# Patient Record
Sex: Female | Born: 1990 | Race: White | Hispanic: No | Marital: Single | Smoking: Current every day smoker
Health system: Southern US, Community
[De-identification: ages and names within clinical notes are randomized; demographics above are authoritative.]

## PROBLEM LIST (undated history)

## (undated) ENCOUNTER — Ambulatory Visit: Discharge status: Absent without leave | Payer: Medicaid Other

## (undated) ENCOUNTER — Inpatient Hospital Stay: Payer: Self-pay

## (undated) DIAGNOSIS — O139 Gestational [pregnancy-induced] hypertension without significant proteinuria, unspecified trimester: Secondary | ICD-10-CM

## (undated) DIAGNOSIS — F1910 Other psychoactive substance abuse, uncomplicated: Secondary | ICD-10-CM

## (undated) DIAGNOSIS — B182 Chronic viral hepatitis C: Secondary | ICD-10-CM

## (undated) DIAGNOSIS — Z720 Tobacco use: Secondary | ICD-10-CM

## (undated) HISTORY — PX: EXTERNAL EAR SURGERY: SHX627

## (undated) HISTORY — PX: WISDOM TOOTH EXTRACTION: SHX21

---
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
## 2009-04-14 ENCOUNTER — Emergency Department: Payer: Self-pay | Admitting: Emergency Medicine

## 2009-10-25 ENCOUNTER — Emergency Department: Payer: Self-pay | Admitting: Emergency Medicine

## 2009-12-05 ENCOUNTER — Ambulatory Visit: Payer: Self-pay | Admitting: Internal Medicine

## 2010-04-22 ENCOUNTER — Emergency Department: Payer: Self-pay | Admitting: Emergency Medicine

## 2010-09-08 ENCOUNTER — Ambulatory Visit: Payer: Self-pay | Admitting: Internal Medicine

## 2012-04-07 DIAGNOSIS — N6019 Diffuse cystic mastopathy of unspecified breast: Secondary | ICD-10-CM | POA: Insufficient documentation

## 2012-12-30 DIAGNOSIS — G43909 Migraine, unspecified, not intractable, without status migrainosus: Secondary | ICD-10-CM | POA: Insufficient documentation

## 2013-01-15 ENCOUNTER — Ambulatory Visit: Payer: Self-pay | Admitting: Physician Assistant

## 2016-04-13 ENCOUNTER — Emergency Department
Admission: EM | Admit: 2016-04-13 | Discharge: 2016-04-13 | Disposition: A | Discharge status: Home / Self Care | Payer: Self-pay | Attending: Emergency Medicine | Admitting: Emergency Medicine

## 2016-04-13 DIAGNOSIS — T401X1A Poisoning by heroin, accidental (unintentional), initial encounter: Secondary | ICD-10-CM | POA: Insufficient documentation

## 2016-04-13 MED ORDER — SODIUM CHLORIDE 0.9 % IV BOLUS (SEPSIS)
1000.0000 mL | Freq: Once | INTRAVENOUS | Status: AC
  Administered 2016-04-13: 1000 mL via INTRAVENOUS

## 2016-04-13 MED ORDER — NALOXONE HCL 2 MG/2ML IJ SOSY
0.4000 mg | PREFILLED_SYRINGE | Freq: Once | INTRAMUSCULAR | Status: AC
  Administered 2016-04-13: 0.4 mg via INTRAVENOUS

## 2016-04-13 NOTE — ED Provider Notes (Signed)
Time Seen: Approximately 1941  I have reviewed the triage notes  Chief Complaint: Drug Overdose   History of Present Illness: Karen PaliStephanie N Medal is a 25 y.o. female who presents after a heroin overdose. Patient was brought here by her boyfriend and states this was not heroin laced with said no. Patient's used heroin in the past. Patient arrives respirations at 6 pulse ox is normal and seems to be protecting her airway.   No past medical history on file.  There are no active problems to display for this patient.   No past surgical history on file.  No past surgical history on file.    Allergies:  Latex  Family History: No family history on file.  Social History: Social History  Substance Use Topics  . Smoking status: Not on file  . Smokeless tobacco: Not on file  . Alcohol use Not on file     Review of Systems:   10 point review of systems was performed and was otherwise negative: After Narcan the patient was able to give a review of systems Constitutional: No fever Eyes: No visual disturbances ENT: No sore throat, ear pain Cardiac: No chest pain Respiratory: No shortness of breath, wheezing, or stridor Abdomen: No abdominal pain, no vomiting, No diarrhea Endocrine: No weight loss, No night sweats Extremities: No peripheral edema, cyanosis Skin: No rashes, easy bruising Neurologic: No focal weakness, trouble with speech or swollowing Urologic: No dysuria, Hematuria, or urinary frequency She denies any risk of being pregnant  Physical Exam:  ED Triage Vitals  Enc Vitals Group     BP 04/13/16 1914 (!) 152/91     Pulse Rate 04/13/16 1914 (!) 146     Resp 04/13/16 1914 (!) 6     Temp 04/13/16 1946 98.1 F (36.7 C)     Temp Source 04/13/16 1946 Oral     SpO2 04/13/16 1915 100 %     Weight 04/13/16 1914 134 lb (60.8 kg)     Height 04/13/16 1914 5\' 4"  (1.626 m)     Head Circumference --      Peak Flow --      Pain Score 04/13/16 1946 0     Pain Loc --       Pain Edu? --      Excl. in GC? --     General: Patient arrives obtunded with respirations at 6. Head: Normal cephalic , atraumatic Eyes: Pupils equal , round and pinpoint Nose/Throat: No nasal drainage, patent upper airway without erythema or exudate. Positive gag reflex Neck: Supple, Full range of motion, No anterior adenopathy or palpable thyroid masses Lungs: Clear to ascultation without wheezes , rhonchi, or rales Heart: Regular rate, regular rhythm without murmurs , gallops , or rubs Abdomen: Soft, non tender without rebound, guarding , or rigidity; bowel sounds positive and symmetric in all 4 quadrants. No organomegaly .        Extremities: 2 plus symmetric pulses. No edema, clubbing or cyanosis. Old scars from previous drug abuse Neurologic: normal ambulation, Motor symmetric without deficits, sensory intact Skin: warm, dry, no rashes   L  ED Course: * Patient was given IV Narcan with arousal and the patient was observed here in emergency department. She has no other physical complaints. She does have plans to acquire [facility]. He denies any suicidal thoughts, homicidal thoughts, hallucinations. She had no intent of harming herself and regrets her decision to use the heroin. Patient was  observed post Narcan was awake alert and ambulatory without assistance. She is currently here with her boyfriend and states he will observe her and if she has any drowsiness etc. to return here to the emergency department. Clinical Course     Final Clinical Impression:   Final diagnoses:  Accidental overdose of heroin, initial encounter     Plan:  Outpatient management Patient was advised to return immediately if condition worsens. Patient was advised to follow up with their primary care physician or other specialized physicians involved in their outpatient care. The patient and/or family member/power of attorney had laboratory results  reviewed at the bedside. All questions and concerns were addressed and appropriate discharge instructions were distributed by the nursing staff.            Jennye MoccasinBrian S Joany Khatib, MD 04/13/16 2037

## 2016-04-13 NOTE — ED Triage Notes (Signed)
Pt here with heroin overdose. Pt arrives with resp rate 6. md at bedside.

## 2016-04-13 NOTE — Discharge Instructions (Signed)
Please continue to seek outpatient drug treatment programs. Please drink plenty of fluids and return here especially develops a fever, chest pain or any other new concerns.  Please return immediately if condition worsens. Please contact her primary physician or the physician you were given for referral. If you have any specialist physicians involved in her treatment and plan please also contact them. Thank you for using [facility] emergency [facility].

## 2017-04-23 NOTE — L&D Delivery Note (Signed)
Delivery Note Primary OB: [facility] Delivery Provider: Marcelyn BruinsJacelyn Schmid, CNM Gestational Age: Premature at 6533 weeks gestation Antepartum complications: pregnancy-induced hypertension Intrapartum complications: IUFD present on arrival to Labor and Delivery  A non-viable female was delivered via vertex presentation. Delivery of the shoulders was accomplished with suprapubic pressure and the body followed without difficulty. The infant was placed on the maternal abdomen.   The umbilical cord was doubly clamped and cut. The placenta was delivered spontaneously and was inspected and found to be intact  The cervix and vagina were inspected. There were no lacerations. The fundus was firm. All counts were correct.  Anesthesia:  IV sedation Episiotomy:  none Lacerations:  none Suture Repair: none Est. Blood Loss (mL):  225 mL   Placenta to pathology.  Marcelyn BruinsJacelyn Schmid, CNM [facility], [facility] 07/04/2017  11:55 AM

## 2017-05-08 ENCOUNTER — Ambulatory Visit
Admission: EM | Admit: 2017-05-08 | Discharge: 2017-05-08 | Disposition: A | Discharge status: Home / Self Care | Payer: Self-pay | Attending: Family Medicine | Admitting: Family Medicine

## 2017-05-08 ENCOUNTER — Ambulatory Visit (INDEPENDENT_AMBULATORY_CARE_PROVIDER_SITE_OTHER): Payer: Self-pay

## 2017-05-08 ENCOUNTER — Encounter: Payer: Self-pay | Admitting: *Deleted

## 2017-05-08 DIAGNOSIS — S60212A Contusion of left wrist, initial encounter: Secondary | ICD-10-CM

## 2017-05-08 DIAGNOSIS — S63502A Unspecified sprain of left wrist, initial encounter: Secondary | ICD-10-CM

## 2017-05-08 DIAGNOSIS — M79645 Pain in left finger(s): Secondary | ICD-10-CM

## 2017-05-08 DIAGNOSIS — W19XXXA Unspecified fall, initial encounter: Secondary | ICD-10-CM

## 2017-05-08 MED ORDER — HYDROCODONE-ACETAMINOPHEN 5-325 MG PO TABS: ORAL_TABLET | ORAL | 0 refills | Status: DC

## 2017-05-08 MED ORDER — KETOROLAC TROMETHAMINE 60 MG/2ML IM SOLN
60.0000 mg | Freq: Once | INTRAMUSCULAR | Status: AC
  Administered 2017-05-08: 60 mg via INTRAMUSCULAR

## 2017-05-08 NOTE — ED Provider Notes (Signed)
[facility]    CSN: [id number] Arrival date & time: 05/08/17  1048     History   Chief Complaint Chief Complaint  Patient presents with  . Wrist Pain    HPI Wilmon PaliStephanie N Bohnsack is a 27 y.o. female.   27 yo female with a c/o left wrist and thumb pain after falling last night at home and landing on her hand. Today woke up with more pain and swelling.    The history is provided by the patient.  Wrist Pain     History reviewed. No pertinent past medical history.  There are no active problems to display for this patient.   History reviewed. No pertinent surgical history.  OB History    No data available       Home Medications    Prior to Admission medications   Medication Sig Start Date End Date Taking? Authorizing Provider  HYDROcodone-acetaminophen (NORCO/VICODIN) 5-325 MG tablet 1-2 tabs po q 8 hours prn 05/08/17   Payton Mccallumonty, Johnita Palleschi, MD    Family History Family History  Problem Relation Age of Onset  . Healthy Father     Social History Social History   Tobacco Use  . Smoking status: Current Every Day Smoker  . Smokeless tobacco: Never Used  Substance Use Topics  . Alcohol use: No    Frequency: Never  . Drug use: No     Allergies   Latex   Review of Systems Review of Systems   Physical Exam Triage Vital Signs ED Triage Vitals  Enc Vitals Group     BP 05/08/17 1102 (!) 144/81     Pulse Rate 05/08/17 1102 (!) 105     Resp 05/08/17 1102 16     Temp 05/08/17 1102 97.8 F (36.6 C)     Temp Source 05/08/17 1102 Oral     SpO2 05/08/17 1102 100 %     Weight 05/08/17 1103 157 lb (71.2 kg)     Height 05/08/17 1103 5\' 4"  (1.626 m)     Head Circumference --      Peak Flow --      Pain Score 05/08/17 1103 9     Pain Loc --      Pain Edu? --      Excl. in GC? --    No data found.  Updated Vital Signs BP (!) 144/81 (BP Location: Left Arm)   Pulse (!) 105   Temp 97.8 F (36.6 C) (Oral)   Resp 16   Ht 5\' 4"  (1.626 m)   Wt 157  lb (71.2 kg)   LMP 04/22/2017 (Approximate)   SpO2 100%   BMI 26.95 kg/m   Visual Acuity Right Eye Distance:   Left Eye Distance:   Bilateral Distance:    Right Eye Near:   Left Eye Near:    Bilateral Near:     Physical Exam  Constitutional: She appears well-developed and well-nourished. No distress.  Musculoskeletal:       Left wrist: She exhibits decreased range of motion, tenderness, bony tenderness and swelling. She exhibits no effusion, no crepitus, no deformity and no laceration.       Left hand: She exhibits decreased range of motion, tenderness, bony tenderness and swelling. She exhibits normal two-point discrimination, normal capillary refill, no deformity and no laceration. Normal sensation noted. Normal strength noted.  Skin: She is not diaphoretic.  Nursing note and vitals reviewed.    UC Treatments / Results  Labs (all labs ordered are listed,  but only abnormal results are displayed) Labs Reviewed - No data to display  EKG  EKG Interpretation None       Radiology Dg Wrist Complete Left  Result Date: 05/08/2017 CLINICAL DATA:  Pt tripped and fell last night landing on left arm. Now c/o left wrist pain and edema. EXAM: LEFT WRIST - COMPLETE 3+ VIEW COMPARISON:  None. FINDINGS: There is no evidence of fracture or dislocation. There is no evidence of arthropathy or other focal bone abnormality. Soft tissues are unremarkable. IMPRESSION: Negative. Electronically Signed   By: Corlis Leak M.D.   On: 05/08/2017 11:38   Dg Hand Complete Left  Result Date: 05/08/2017 CLINICAL DATA:  Pt tripped and fell last night landing on left arm. Now c/o left wrist pain and edema. EXAM: LEFT HAND - COMPLETE 3+ VIEW COMPARISON:  None. FINDINGS: There is no evidence of fracture or dislocation. There is no evidence of arthropathy or other focal bone abnormality. Soft tissues are unremarkable. IMPRESSION: Negative. Electronically Signed   By: Corlis Leak M.D.   On: 05/08/2017 11:42     Procedures Procedures (including critical care time)  Medications Ordered in UC Medications  ketorolac (TORADOL) injection 60 mg (60 mg Intramuscular Given 05/08/17 1123)     Initial Impression / Assessment and Plan / UC Course  I have reviewed the triage vital signs and the nursing notes.  Pertinent labs & imaging results that were available during my care of the patient were reviewed by me and considered in my medical decision making (see chart for details).      Final Clinical Impressions(s) / UC Diagnoses   Final diagnoses:  Contusion of left wrist, initial encounter  Sprain of left wrist, initial encounter  Pain of left thumb    ED Discharge Orders        Ordered    HYDROcodone-acetaminophen (NORCO/VICODIN) 5-325 MG tablet     05/08/17 1202     1. x-ray results and diagnosis reviewed with patient 2. rx as per orders above; reviewed possible side effects, interactions, risks and benefits  3. Immobilized with thumb spica splint due to thumb bony tenderness 4. Recommend supportive treatment with otc analgesics, ice, rest 4. Follow-up with orthopedist in 2-3 days for re-evaluation and recheck x-rays  Controlled Substance Prescriptions [place] Controlled Substance Registry consulted? Not Applicable   Payton Mccallum, MD 05/08/17 [phone]

## 2017-05-08 NOTE — Discharge Instructions (Signed)
Recommend follow up with Orthopedist in 2-3 days for recheck/repeat x-rays

## 2017-05-08 NOTE — ED Triage Notes (Signed)
Pt tripped and fell last night landing on left arm. Now c/o left wrist pain and edema.

## 2017-05-11 ENCOUNTER — Telehealth: Payer: Self-pay

## 2017-05-11 NOTE — Telephone Encounter (Signed)
Called to follow up with patient since visit [facility]. Number Disconnected. Patient will call with any questions or concerns. [facility]

## 2017-05-30 ENCOUNTER — Other Ambulatory Visit: Payer: Self-pay | Admitting: Advanced Practice Midwife

## 2017-05-30 DIAGNOSIS — Z3689 Encounter for other specified antenatal screening: Secondary | ICD-10-CM

## 2017-06-06 ENCOUNTER — Ambulatory Visit
Admission: RE | Admit: 2017-06-06 | Discharge: 2017-06-06 | Disposition: A | Discharge status: Home / Self Care | Payer: Medicaid Other | Source: Ambulatory Visit | Attending: Maternal & Fetal Medicine | Admitting: Maternal & Fetal Medicine

## 2017-06-06 ENCOUNTER — Encounter: Payer: Self-pay | Admitting: *Deleted

## 2017-06-06 DIAGNOSIS — Z3A29 29 weeks gestation of pregnancy: Secondary | ICD-10-CM | POA: Insufficient documentation

## 2017-06-06 DIAGNOSIS — O99323 Drug use complicating pregnancy, third trimester: Secondary | ICD-10-CM | POA: Insufficient documentation

## 2017-06-06 DIAGNOSIS — O163 Unspecified maternal hypertension, third trimester: Secondary | ICD-10-CM | POA: Insufficient documentation

## 2017-06-06 DIAGNOSIS — O09893 Supervision of other high risk pregnancies, third trimester: Secondary | ICD-10-CM | POA: Insufficient documentation

## 2017-06-06 DIAGNOSIS — Z3689 Encounter for other specified antenatal screening: Secondary | ICD-10-CM

## 2017-06-14 ENCOUNTER — Observation Stay
Admission: EM | Admit: 2017-06-14 | Discharge: 2017-06-14 | Disposition: A | Discharge status: Home / Self Care | Payer: Medicaid Other | Attending: Obstetrics & Gynecology | Admitting: Obstetrics & Gynecology

## 2017-06-14 ENCOUNTER — Encounter: Payer: Self-pay | Admitting: *Deleted

## 2017-06-14 ENCOUNTER — Other Ambulatory Visit: Payer: Self-pay

## 2017-06-14 DIAGNOSIS — O98419 Viral hepatitis complicating pregnancy, unspecified trimester: Secondary | ICD-10-CM

## 2017-06-14 DIAGNOSIS — O133 Gestational [pregnancy-induced] hypertension without significant proteinuria, third trimester: Secondary | ICD-10-CM | POA: Diagnosis not present

## 2017-06-14 DIAGNOSIS — Z3A30 30 weeks gestation of pregnancy: Secondary | ICD-10-CM | POA: Diagnosis not present

## 2017-06-14 DIAGNOSIS — F419 Anxiety disorder, unspecified: Secondary | ICD-10-CM | POA: Diagnosis not present

## 2017-06-14 DIAGNOSIS — F17210 Nicotine dependence, cigarettes, uncomplicated: Secondary | ICD-10-CM | POA: Diagnosis not present

## 2017-06-14 DIAGNOSIS — O99333 Smoking (tobacco) complicating pregnancy, third trimester: Secondary | ICD-10-CM | POA: Insufficient documentation

## 2017-06-14 DIAGNOSIS — O139 Gestational [pregnancy-induced] hypertension without significant proteinuria, unspecified trimester: Secondary | ICD-10-CM

## 2017-06-14 DIAGNOSIS — O163 Unspecified maternal hypertension, third trimester: Secondary | ICD-10-CM | POA: Diagnosis present

## 2017-06-14 DIAGNOSIS — O99343 Other mental disorders complicating pregnancy, third trimester: Secondary | ICD-10-CM | POA: Insufficient documentation

## 2017-06-14 DIAGNOSIS — B182 Chronic viral hepatitis C: Secondary | ICD-10-CM

## 2017-06-14 DIAGNOSIS — F1910 Other psychoactive substance abuse, uncomplicated: Secondary | ICD-10-CM

## 2017-06-14 DIAGNOSIS — Z91040 Latex allergy status: Secondary | ICD-10-CM | POA: Diagnosis not present

## 2017-06-14 DIAGNOSIS — O99331 Smoking (tobacco) complicating pregnancy, first trimester: Secondary | ICD-10-CM

## 2017-06-14 HISTORY — DX: Gestational (pregnancy-induced) hypertension without significant proteinuria, unspecified trimester: O13.9

## 2017-06-14 HISTORY — DX: Chronic viral hepatitis C: B18.2

## 2017-06-14 HISTORY — DX: Other psychoactive substance abuse, uncomplicated: F19.10

## 2017-06-14 HISTORY — DX: Tobacco use: Z72.0

## 2017-06-14 LAB — PROTEIN / CREATININE RATIO, URINE: Creatinine, Urine: 16 mg/dL

## 2017-06-14 MED ORDER — ACETAMINOPHEN 500 MG PO TABS
1000.0000 mg | ORAL_TABLET | Freq: Four times a day (QID) | ORAL | Status: DC | PRN
  Administered 2017-06-14: 1000 mg via ORAL

## 2017-06-14 MED ORDER — ACETAMINOPHEN 500 MG PO TABS
ORAL_TABLET | ORAL | Status: AC
  Administered 2017-06-14: 1000 mg via ORAL
  Filled 2017-06-14: qty 2

## 2017-06-14 MED ORDER — HYDROXYZINE HCL 25 MG PO TABS: 25.0000 mg | ORAL_TABLET | Freq: Four times a day (QID) | ORAL | 1 refills | Status: DC

## 2017-06-14 MED ORDER — HYDROXYZINE HCL 25 MG PO TABS
25.0000 mg | ORAL_TABLET | Freq: Four times a day (QID) | ORAL | Status: DC | PRN
  Administered 2017-06-14: 25 mg via ORAL
  Filled 2017-06-14 (×2): qty 1

## 2017-06-14 MED ORDER — PRENATAL MULTIVITAMIN CH: 1.0000 | ORAL_TABLET | Freq: Every day | ORAL | 3 refills | Status: DC

## 2017-06-14 NOTE — Final Progress Note (Signed)
Physician Final Progress Note  Patient ID: Karen Brooks MRN: [id number] DOB/AGE: 19-Apr-1991 27 y.o.  Admit date: 06/14/2017 Admitting provider: Jill Side L. Sharen Hones, CNM/ Dr Annamarie Major  Discharge date: 06/14/2017   Admission Diagnoses: IUP at 30wk2days with elevated blood pressures  Discharge Diagnoses:  IUP at 30 weeks 2 days with gestational hypertension  Consults: None  Significant Findings/ Diagnostic Studies: 27 year old G1 P0 with EDC=08/21/2017 by a 29wk1d ultrasound presents at [facility] for a blood pressure check. She was seen at [facility] yesterday at which time BP154/102 and 172/109, but there was no proteinuria.Marland Kitchen She was advised to go to L&D for further evaluation, but did not have a ride. She had PIH labs drawn yesterday. PIH labs were normal: BUN=12, cr=0.71, AST and ALT WNL, platelets=234K. UDS waa also negative yesterday. She has been having some headaches (all over head), but no visual changes, nausea and vomiting, epigastric pain, SOB or CP. No vaginal bleeding. Baby moving well. Occasionally feels uterine tightening. Prenatal care remarkable for polysubstance abuse/ IVDU  (MJ, cocaine, heroin), tobacco use, chronic hepatitis C, inadequate prenatal care, third trimester ultrasound dating pregnancy,and patient incarceration  03/07/2017.   Past Medical History:  Diagnosis Date  . Chronic hepatitis C (HCC)   . Gestational hypertension   . Polysubstance abuse (HCC)    MJ, cocaine, heroin  . Tobacco use    Past Surgical History:  Procedure Laterality Date  . EXTERNAL EAR SURGERY    . WISDOM TOOTH EXTRACTION      Social History: Smokes 1/2 PPD (was 1.5 PPD, last used alcohol end of august. Last used cocaine and MJ in 02/2017 and heroin 08/2016.  Family History  Problem Relation Age of Onset  . Healthy Father   . Asthma Maternal Grandmother   . COPD Paternal Grandmother    Exam: General: very upset with being ta labor and delivery. Wants to visit GF at [facility].  Yelling and arguing with sister (who left to pick up her boyfriend) and arguing and crying while talking to boyfriend on the phone. Blood pressures elevated more as she became more upset.  Patient Vitals for the past 24 hrs:  BP Temp Temp src Pulse Resp Height Weight  06/14/17 1755 (!) 143/89 - - 65 - - -  06/14/17 1739 (!) 150/112 - - 69 - - -  06/14/17 1738 (!) 155/85 - - 62 - - -  06/14/17 1732 (!) 166/119 - - 68 - - -  06/14/17 1725 (!) 165/95 - - 75 - - -  06/14/17 1713 (!) 150/106 - - 75 - - -  06/14/17 1709 (!) 163/107 - - 79 - - -  06/14/17 1655 (!) 156/100 - - 72 - - -  06/14/17 1640 (!) 146/104 - - 75 - - -  06/14/17 1634 (!) 151/93 - - 67 - - -  06/14/17 1624 125/72 - - 70 - - -  06/14/17 1610 121/74 - - 73 - - -  06/14/17 1554 127/63 - - 71 - - -  06/14/17 1541 - - - - - 5\' 4"  (1.626 m) 72.8 kg (160 lb 6.4 oz)  06/14/17 1539 (!) 140/91 - - 77 - - -  06/14/17 1524 136/87 98.4 F (36.9 C) Oral 74 16 - -   Heart: RRR without murmur Lungs: CTA Abdomen: soft, gravid, NT upper abdomen FHR: 125-130 baseline with accelerations to 135-140, moderate variability. There were two decelerations with two contractions, shortly after arrival.  Toco: occasional contraction Extremities: no  edema Neuro: oriented x3 Psyche: mood swings, irritability, poor judgement  A/P: IUP at 30 wk 2 days with gestational hypertension and headache  Some blood pressures in severe range, but may be due to emotional upset, and blood pressures  higher the more upset she becomes  PC ratio pending, but patient anxious to leave with her ride  FWB-Cat 2 initially, but 2+ hours of Cat 1 tracing since then  Discussed case with Dr Tiburcio PeaHarris and POM.  Will discharge patient and have her return tomorrow for blood pressure check and [facility]  [facility]. RX for prenatal vitamins and atarax 25 mgm q6hours for anxiety  Explained seriousness of elevated blood pressures and possibility of preeclampsia with risks of  seizures, stroke, abruption, fetal death, hemorrhage, decreased liver and kidney function.   Encouraged to rest, decrease smoking, and rtn to l&D tomorrow and if BPs OK, [facility]. Given preeclampsia precautions. FKCs BID     Procedures: none  Discharge Condition: stable  Disposition: 01-Home or Self Care  Diet: Regular diet  Discharge Activity: no strenuous activity  Discharge Instructions    Discharge patient   Complete by:  As directed    Discharge disposition:  01-Home or Self Care   Discharge patient date:  06/14/2017     Allergies as of 06/14/2017      Reactions   Latex Rash      Medication List    STOP taking these medications   HYDROcodone-acetaminophen 5-325 MG tablet Commonly known as:  NORCO/VICODIN     TAKE these medications   calcium carbonate 750 MG chewable tablet Commonly known as:  TUMS EX Chew 1 tablet by mouth daily.   hydrOXYzine 25 MG tablet Commonly known as:  ATARAX/VISTARIL Take 1 tablet (25 mg total) by mouth every 6 (six) hours.   prenatal multivitamin Tabs tablet Take 1 tablet by mouth daily at 12 noon.      Follow-up Information    Farrel ConnersGutierrez, Raj Landress, CNM Follow up.   Specialty:  Certified Nurse Midwife Why:  [facility] Contact information: [address] [place] [address] [phone]         and follow up tomorrow at L&D for blood pressure check and follow up on PC ratio  Total time spent taking care of this patient: 30 minutes  Signed: Farrel Connersolleen Gahel Safley 06/14/2017, 9:59 PM

## 2017-06-14 NOTE — OB Triage Note (Signed)
Was seen @ health department yesterday and told to come to the hospital for BP evaluation. Reports she didn't have a ride. Here today for BP evaluation. Denies HA, visual disturbances, epigastric pain. Karen Brooks, Jemia Fata S

## 2017-06-14 NOTE — Discharge Instructions (Signed)
Preeclampsia and Eclampsia °Preeclampsia is a serious condition that develops only during pregnancy. It is also called toxemia of pregnancy. This condition causes high blood pressure along with other symptoms, such as swelling and headaches. These symptoms may develop as the condition gets worse. Preeclampsia may occur at 20 weeks of pregnancy or later. °Diagnosing and treating preeclampsia early is very important. If not treated early, it can cause serious problems for you and your baby. One problem it can lead to is eclampsia, which is a condition that causes muscle jerking or shaking (convulsions or seizures) in the mother. Delivering your baby is the best treatment for preeclampsia or eclampsia. Preeclampsia and eclampsia symptoms usually go away after your baby is born. °What are the causes? °The cause of preeclampsia is not known. °What increases the risk? °The following risk factors make you more likely to develop preeclampsia: °· Being pregnant for the first time. °· Having had preeclampsia during a past pregnancy. °· Having a family history of preeclampsia. °· Having high blood pressure. °· Being pregnant with twins or triplets. °· Being 35 or older. °· Being African-American. °· Having kidney disease or diabetes. °· Having medical conditions such as lupus or blood diseases. °· Being very overweight (obese). ° °What are the signs or symptoms? °The earliest signs of preeclampsia are: °· High blood pressure. °· Increased protein in your urine. Your health care provider will check for this at every visit before you give birth (prenatal visit). ° °Other symptoms that may develop as the condition gets worse include: °· Severe headaches. °· Sudden weight gain. °· Swelling of the hands, face, legs, and feet. °· Nausea and vomiting. °· Vision problems, such as blurred or double vision. °· Numbness in the face, arms, legs, and feet. °· Urinating less than usual. °· Dizziness. °· Slurred speech. °· Abdominal pain,  especially upper abdominal pain. °· Convulsions or seizures. ° °Symptoms generally go away after giving birth. °How is this diagnosed? °There are no screening tests for preeclampsia. Your health care provider will ask you about symptoms and check for signs of preeclampsia during your prenatal visits. You may also have tests that include: °· Urine tests. °· Blood tests. °· Checking your blood pressure. °· Monitoring your baby’s heart rate. °· Ultrasound. ° °How is this treated? °You and your health care provider will determine the treatment approach that is best for you. Treatment may include: °· Having more frequent prenatal exams to check for signs of preeclampsia, if you have an increased risk for preeclampsia. °· Bed rest. °· Reducing how much salt (sodium) you eat. °· Medicine to lower your blood pressure. °· Staying in the hospital, if your condition is severe. There, treatment will focus on controlling your blood pressure and the amount of fluids in your body (fluid retention). °· You may need to take medicine (magnesium sulfate) to prevent seizures. This medicine may be given as an injection or through an IV tube. °· Delivering your baby early, if your condition gets worse. You may have your labor started with medicine (induced), or you may have a cesarean delivery. ° °Follow these instructions at home: °Eating and drinking ° °· Drink enough fluid to keep your urine clear or pale yellow. °· Eat a healthy diet that is low in sodium. Do not add salt to your food. Check nutrition labels to see how much sodium a food or beverage contains. °· Avoid caffeine. °Lifestyle °· Do not use any products that contain nicotine or tobacco, such as cigarettes   and e-cigarettes. If you need help quitting, ask your health care provider. °· Do not use alcohol or drugs. °· Avoid stress as much as possible. Rest and get plenty of sleep. °General instructions °· Take over-the-counter and prescription medicines only as told by your  health care provider. °· When lying down, lie on your side. This keeps pressure off of your baby. °· When sitting or lying down, raise (elevate) your feet. Try putting some pillows underneath your lower legs. °· Exercise regularly. Ask your health care provider what kinds of exercise are best for you. °· Keep all follow-up and prenatal visits as told by your health care provider. This is important. °How is this prevented? °To prevent preeclampsia or eclampsia from developing during another pregnancy: °· Get proper medical care during pregnancy. Your health care provider may be able to prevent preeclampsia or diagnose and treat it early. °· Your health care provider may have you take a low-dose aspirin or a calcium supplement during your next pregnancy. °· You may have tests of your blood pressure and kidney function after giving birth. °· Maintain a healthy weight. Ask your health care provider for help managing weight gain during pregnancy. °· Work with your health care provider to manage any long-term (chronic) health conditions you have, such as diabetes or kidney problems. ° °Contact a health care provider if: °· You gain more weight than expected. °· You have headaches. °· You have nausea or vomiting. °· You have abdominal pain. °· You feel dizzy or light-headed. °Get help right away if: °· You develop sudden or severe swelling anywhere in your body. This usually happens in the legs. °· You gain 5 lbs (2.3 kg) or more during one week. °· You have severe: °? Abdominal pain. °? Headaches. °? Dizziness. °? Vision problems. °? Confusion. °? Nausea or vomiting. °· You have a seizure. °· You have trouble moving any part of your body. °· You develop numbness in any part of your body. °· You have trouble speaking. °· You have any abnormal bleeding. °· You pass out. °This information is not intended to replace advice given to you by your health care provider. Make sure you discuss any questions you have with your health  care provider. °Document Released: 04/06/2000 Document Revised: 12/06/2015 Document Reviewed: 11/14/2015 °Elsevier Interactive Patient Education © 2018 [employer] ° °

## 2017-06-17 ENCOUNTER — Ambulatory Visit (INDEPENDENT_AMBULATORY_CARE_PROVIDER_SITE_OTHER): Payer: Medicaid Other | Admitting: Certified Nurse Midwife

## 2017-06-17 VITALS — BP 138/98 | Wt 159.0 lb

## 2017-06-17 DIAGNOSIS — O0993 Supervision of high risk pregnancy, unspecified, third trimester: Secondary | ICD-10-CM

## 2017-06-17 DIAGNOSIS — O133 Gestational [pregnancy-induced] hypertension without significant proteinuria, third trimester: Secondary | ICD-10-CM | POA: Diagnosis not present

## 2017-06-17 DIAGNOSIS — Z3A30 30 weeks gestation of pregnancy: Secondary | ICD-10-CM | POA: Diagnosis not present

## 2017-06-17 LAB — FETAL NONSTRESS TEST

## 2017-06-17 NOTE — Progress Notes (Signed)
Pt c/o feels like she has a UTI. Follow up from L&D for BP check. NST today. Pt unsure if she can provide urine sample. [facility]/17

## 2017-06-17 NOTE — Progress Notes (Signed)
27 yo HROB patient at 30wk5 days dated by a 29 week ultrasound, who presents for a follow up blood pressure check/ NSTafter a L&D visit on 22 Feb for elevated blood pressures. Was in the process of transferring her care [facility] ([facility]). Blood pressures at [facility] in the 150s/100s. Had baseline Preeclampsia labs done 21 Feb at [facility] and they were WNL. PC ratio in L&D was too low to calculate, but blood pressures were in the low to severe range. Was under great emotional strain at the time (arguing/fighting with sister and FOB). Was given Atarax 25 mgm  q6 hrs prn for anxiety on Friday and she says that they have been helping her stay a little calmer. BPs at home with diastolics in the 90s. Has been having headaches "all over her head." Tylenol +/- effective. No edema. Baby active. NST at L&D 22 Feb was reactive with 10x10  Accelerations, but she had a couple of early decelerations with contractions shortly after the external monitors were applied while supine.  Prenatal care also remarkable for a history of substance abuse including cocaine, heroin and MJ, (Last UDS 21 February was negative), Chronic Hepatitis C, and tobacco use.  BP today 138/98. Unable to provide urine specimen. NST: Baseline 130 with accelerations to 140s, moderate variability, no decelerations  A: IUP at 30wk5d with gestational hypertension  P: RTO 27 Feb for transfer in appointment Has growth scan scheduled with DP in 2 weeks.  Farrel Connersolleen Damarys Speir, CNM

## 2017-06-19 ENCOUNTER — Encounter: Payer: Self-pay | Admitting: Obstetrics & Gynecology

## 2017-06-24 ENCOUNTER — Encounter: Payer: Self-pay | Admitting: Maternal Newborn

## 2017-06-24 DIAGNOSIS — N390 Urinary tract infection, site not specified: Secondary | ICD-10-CM | POA: Insufficient documentation

## 2017-06-28 ENCOUNTER — Encounter: Payer: Self-pay | Admitting: Maternal Newborn

## 2017-07-01 ENCOUNTER — Other Ambulatory Visit: Payer: Self-pay | Admitting: *Deleted

## 2017-07-01 DIAGNOSIS — O133 Gestational [pregnancy-induced] hypertension without significant proteinuria, third trimester: Secondary | ICD-10-CM

## 2017-07-01 DIAGNOSIS — F1910 Other psychoactive substance abuse, uncomplicated: Secondary | ICD-10-CM

## 2017-07-04 ENCOUNTER — Inpatient Hospital Stay
Admission: RE | Admit: 2017-07-04 | Discharge: 2017-07-05 | DRG: 806 | Disposition: A | Discharge status: Home / Self Care | Payer: Medicaid Other | Source: Ambulatory Visit | Attending: Obstetrics and Gynecology | Admitting: Obstetrics and Gynecology

## 2017-07-04 ENCOUNTER — Other Ambulatory Visit: Payer: Self-pay

## 2017-07-04 ENCOUNTER — Encounter: Payer: Medicaid Other | Admitting: Advanced Practice Midwife

## 2017-07-04 ENCOUNTER — Ambulatory Visit: Payer: Self-pay

## 2017-07-04 DIAGNOSIS — F1410 Cocaine abuse, uncomplicated: Secondary | ICD-10-CM | POA: Diagnosis present

## 2017-07-04 DIAGNOSIS — Z3A33 33 weeks gestation of pregnancy: Secondary | ICD-10-CM | POA: Diagnosis not present

## 2017-07-04 DIAGNOSIS — O134 Gestational [pregnancy-induced] hypertension without significant proteinuria, complicating childbirth: Secondary | ICD-10-CM | POA: Diagnosis present

## 2017-07-04 DIAGNOSIS — Z590 Homelessness: Secondary | ICD-10-CM | POA: Diagnosis not present

## 2017-07-04 DIAGNOSIS — F1210 Cannabis abuse, uncomplicated: Secondary | ICD-10-CM | POA: Diagnosis present

## 2017-07-04 DIAGNOSIS — Z91410 Personal history of adult physical and sexual abuse: Secondary | ICD-10-CM | POA: Diagnosis not present

## 2017-07-04 DIAGNOSIS — B182 Chronic viral hepatitis C: Secondary | ICD-10-CM | POA: Diagnosis present

## 2017-07-04 DIAGNOSIS — Z653 Problems related to other legal circumstances: Secondary | ICD-10-CM | POA: Diagnosis not present

## 2017-07-04 DIAGNOSIS — Z8742 Personal history of other diseases of the female genital tract: Secondary | ICD-10-CM

## 2017-07-04 DIAGNOSIS — Z9189 Other specified personal risk factors, not elsewhere classified: Secondary | ICD-10-CM | POA: Diagnosis not present

## 2017-07-04 DIAGNOSIS — O9842 Viral hepatitis complicating childbirth: Secondary | ICD-10-CM | POA: Diagnosis present

## 2017-07-04 DIAGNOSIS — F1010 Alcohol abuse, uncomplicated: Secondary | ICD-10-CM | POA: Diagnosis present

## 2017-07-04 DIAGNOSIS — O26893 Other specified pregnancy related conditions, third trimester: Secondary | ICD-10-CM | POA: Diagnosis present

## 2017-07-04 DIAGNOSIS — O99324 Drug use complicating childbirth: Secondary | ICD-10-CM | POA: Diagnosis present

## 2017-07-04 DIAGNOSIS — Z9119 Patient's noncompliance with other medical treatment and regimen: Secondary | ICD-10-CM

## 2017-07-04 DIAGNOSIS — F1110 Opioid abuse, uncomplicated: Secondary | ICD-10-CM | POA: Diagnosis present

## 2017-07-04 DIAGNOSIS — O99330 Smoking (tobacco) complicating pregnancy, unspecified trimester: Secondary | ICD-10-CM | POA: Diagnosis present

## 2017-07-04 DIAGNOSIS — Z91040 Latex allergy status: Secondary | ICD-10-CM

## 2017-07-04 DIAGNOSIS — F17210 Nicotine dependence, cigarettes, uncomplicated: Secondary | ICD-10-CM | POA: Diagnosis present

## 2017-07-04 DIAGNOSIS — O364XX0 Maternal care for intrauterine death, not applicable or unspecified: Secondary | ICD-10-CM | POA: Diagnosis present

## 2017-07-04 LAB — URINE DRUG SCREEN, QUALITATIVE (ARMC ONLY)
Amphetamines, Ur Screen: NOT DETECTED
BENZODIAZEPINE, UR SCRN: NOT DETECTED
Barbiturates, Ur Screen: NOT DETECTED
CANNABINOID 50 NG, UR ~~LOC~~: NOT DETECTED
Cocaine Metabolite,Ur ~~LOC~~: NOT DETECTED
MDMA (Ecstasy)Ur Screen: NOT DETECTED
Methadone Scn, Ur: NOT DETECTED
Opiate, Ur Screen: NOT DETECTED
Phencyclidine (PCP) Ur S: NOT DETECTED
TRICYCLIC, UR SCREEN: NOT DETECTED

## 2017-07-04 LAB — HEMOGLOBIN A1C
HEMOGLOBIN A1C: 4.5 % — AB (ref 4.8–5.6)
MEAN PLASMA GLUCOSE: 82.45 mg/dL

## 2017-07-04 LAB — CBC
HCT: 42.4 % (ref 35.0–47.0)
HEMOGLOBIN: 14.4 g/dL (ref 12.0–16.0)
MCH: 31.2 pg (ref 26.0–34.0)
MCHC: 33.9 g/dL (ref 32.0–36.0)
MCV: 91.9 fL (ref 80.0–100.0)
PLATELETS: 176 10*3/uL (ref 150–440)
RBC: 4.61 MIL/uL (ref 3.80–5.20)
RDW: 13.3 % (ref 11.5–14.5)
WBC: 18.1 10*3/uL — ABNORMAL HIGH (ref 3.6–11.0)

## 2017-07-04 LAB — KLEIHAUER-BETKE STAIN
Fetal Cells %: 0 %
Quantitation Fetal Hemoglobin: 0 mL

## 2017-07-04 LAB — COMPREHENSIVE METABOLIC PANEL
ALT: 27 U/L (ref 14–54)
ANION GAP: 7 (ref 5–15)
AST: 33 U/L (ref 15–41)
Albumin: 2.8 g/dL — ABNORMAL LOW (ref 3.5–5.0)
Alkaline Phosphatase: 117 U/L (ref 38–126)
BILIRUBIN TOTAL: 0.3 mg/dL (ref 0.3–1.2)
BUN: 7 mg/dL (ref 6–20)
CALCIUM: 8.3 mg/dL — AB (ref 8.9–10.3)
CO2: 19 mmol/L — AB (ref 22–32)
Chloride: 108 mmol/L (ref 101–111)
Creatinine, Ser: 0.46 mg/dL (ref 0.44–1.00)
GFR calc non Af Amer: >60 mL/min (ref >60)
Glucose, Bld: 90 mg/dL (ref 65–99)
POTASSIUM: 3.9 mmol/L (ref 3.5–5.1)
SODIUM: 134 mmol/L — AB (ref 135–145)
TOTAL PROTEIN: 6.6 g/dL (ref 6.5–8.1)

## 2017-07-04 LAB — TYPE AND SCREEN
ABO/RH(D): B POS
ANTIBODY SCREEN: NEGATIVE

## 2017-07-04 LAB — ANTITHROMBIN III: AntiThromb III Func: 109 % (ref 75–120)

## 2017-07-04 MED ORDER — FENTANYL CITRATE (PF) 100 MCG/2ML IJ SOLN
50.0000 ug | INTRAMUSCULAR | Status: DC | PRN
  Administered 2017-07-04: 50 ug via INTRAVENOUS

## 2017-07-04 MED ORDER — LABETALOL HCL 5 MG/ML IV SOLN: 80.0000 mg | Freq: Once | INTRAVENOUS | Status: DC | PRN

## 2017-07-04 MED ORDER — ACETAMINOPHEN 325 MG PO TABS: 650.0000 mg | ORAL_TABLET | ORAL | Status: DC | PRN

## 2017-07-04 MED ORDER — WITCH HAZEL-GLYCERIN EX PADS: 1.0000 "application " | MEDICATED_PAD | CUTANEOUS | Status: DC | PRN

## 2017-07-04 MED ORDER — SENNOSIDES-DOCUSATE SODIUM 8.6-50 MG PO TABS
2.0000 | ORAL_TABLET | ORAL | Status: DC
  Filled 2017-07-04: qty 2

## 2017-07-04 MED ORDER — ONDANSETRON HCL 4 MG PO TABS: 4.0000 mg | ORAL_TABLET | ORAL | Status: DC | PRN

## 2017-07-04 MED ORDER — COCONUT OIL OIL: 1.0000 "application " | TOPICAL_OIL | Status: DC | PRN

## 2017-07-04 MED ORDER — LACTATED RINGERS IV SOLN
INTRAVENOUS | Status: DC
  Administered 2017-07-04: 1000 mL via INTRAVENOUS
  Administered 2017-07-04: 09:00:00 via INTRAVENOUS

## 2017-07-04 MED ORDER — LABETALOL HCL 5 MG/ML IV SOLN: 20.0000 mg | Freq: Once | INTRAVENOUS | Status: DC | PRN

## 2017-07-04 MED ORDER — DIBUCAINE 1 % RE OINT: 1.0000 "application " | TOPICAL_OINTMENT | RECTAL | Status: DC | PRN

## 2017-07-04 MED ORDER — SIMETHICONE 80 MG PO CHEW: 80.0000 mg | CHEWABLE_TABLET | ORAL | Status: DC | PRN

## 2017-07-04 MED ORDER — PRENATAL MULTIVITAMIN CH: 1.0000 | ORAL_TABLET | Freq: Every day | ORAL | Status: DC

## 2017-07-04 MED ORDER — OXYCODONE-ACETAMINOPHEN 5-325 MG PO TABS: 1.0000 | ORAL_TABLET | ORAL | Status: DC | PRN

## 2017-07-04 MED ORDER — OXYCODONE-ACETAMINOPHEN 5-325 MG PO TABS
2.0000 | ORAL_TABLET | ORAL | Status: DC | PRN
  Administered 2017-07-04 – 2017-07-05 (×5): 2 via ORAL
  Filled 2017-07-04 (×5): qty 2

## 2017-07-04 MED ORDER — BENZOCAINE-MENTHOL 20-0.5 % EX AERO: 1.0000 "application " | INHALATION_SPRAY | CUTANEOUS | Status: DC | PRN

## 2017-07-04 MED ORDER — DIPHENHYDRAMINE HCL 25 MG PO CAPS: 25.0000 mg | ORAL_CAPSULE | Freq: Four times a day (QID) | ORAL | Status: DC | PRN

## 2017-07-04 MED ORDER — OXYCODONE-ACETAMINOPHEN 5-325 MG PO TABS
ORAL_TABLET | ORAL | Status: AC
  Administered 2017-07-04: 2 via ORAL
  Filled 2017-07-04: qty 2

## 2017-07-04 MED ORDER — OXYTOCIN BOLUS FROM INFUSION
500.0000 mL | Freq: Once | INTRAVENOUS | Status: AC
  Administered 2017-07-04: 500 mL via INTRAVENOUS

## 2017-07-04 MED ORDER — NICOTINE 21 MG/24HR TD PT24
21.0000 mg | MEDICATED_PATCH | Freq: Every day | TRANSDERMAL | Status: DC
  Administered 2017-07-04: 21 mg via TRANSDERMAL
  Filled 2017-07-04: qty 1

## 2017-07-04 MED ORDER — LACTATED RINGERS IV SOLN: 500.0000 mL | INTRAVENOUS | Status: DC | PRN

## 2017-07-04 MED ORDER — AMMONIA AROMATIC IN INHA
RESPIRATORY_TRACT | Status: AC
  Filled 2017-07-04: qty 10

## 2017-07-04 MED ORDER — ONDANSETRON HCL 4 MG/2ML IJ SOLN: 4.0000 mg | INTRAMUSCULAR | Status: DC | PRN

## 2017-07-04 MED ORDER — IBUPROFEN 600 MG PO TABS: 600.0000 mg | ORAL_TABLET | Freq: Four times a day (QID) | ORAL | Status: DC

## 2017-07-04 MED ORDER — FENTANYL CITRATE (PF) 100 MCG/2ML IJ SOLN
INTRAMUSCULAR | Status: AC
  Filled 2017-07-04: qty 2

## 2017-07-04 MED ORDER — TETANUS-DIPHTH-ACELL PERTUSSIS 5-2.5-18.5 LF-MCG/0.5 IM SUSP
0.5000 mL | Freq: Once | INTRAMUSCULAR | Status: DC
  Filled 2017-07-04: qty 0.5

## 2017-07-04 MED ORDER — OXYTOCIN 40 UNITS IN LACTATED RINGERS INFUSION - SIMPLE MED
2.5000 [IU]/h | INTRAVENOUS | Status: DC
  Administered 2017-07-04: 2.5 [IU]/h via INTRAVENOUS
  Filled 2017-07-04: qty 1000

## 2017-07-04 MED ORDER — OXYCODONE-ACETAMINOPHEN 5-325 MG PO TABS
1.0000 | ORAL_TABLET | Freq: Four times a day (QID) | ORAL | Status: DC | PRN
  Administered 2017-07-04: 2 via ORAL

## 2017-07-04 MED ORDER — ONDANSETRON HCL 4 MG/2ML IJ SOLN: 4.0000 mg | Freq: Four times a day (QID) | INTRAMUSCULAR | Status: DC | PRN

## 2017-07-04 MED ORDER — LABETALOL HCL 5 MG/ML IV SOLN: 40.0000 mg | Freq: Once | INTRAVENOUS | Status: DC | PRN

## 2017-07-04 NOTE — Progress Notes (Signed)
Chaplain [facility] up [facility]  As instructed, the Chaplain attempted to visit the patient at 19:56.  After consultation with the patient's nurse and the nurse's conversation with the patient it was agreed that a pastoral visit would be best prior to discharge on 07/05/2017.  Chaplain gave the nurse the on-call pager number, in case the patient changed her mind.

## 2017-07-04 NOTE — Progress Notes (Signed)
   07/04/17 1440  Clinical Encounter Type  Visited With Patient and family together;Health care provider  Visit Type Death  Referral From Nurse  Consult/Referral To Chaplain  Spiritual Encounters  Spiritual Needs Prayer;Emotional;Grief support   Chaplain inquired with staff about patient need of support due to daughter's death.  Nurse checked with patient who allowed chaplain visit.  Chaplain prayed with family, offered emotional support.  Chaplain asked if patient would like a prayer shawl and cap for her daughter; patient agreed, chaplain brought items for daughter. Patient asked chaplain to walk with daughter while her photos were taken.  Chaplain maintained pastoral presence and assisted with holding backdrop.  Chaplain inquired about ongoing follow up with patient/family.  Patient requested that on-call chaplain check in just once this evening.  This chaplain will update on-call chaplain.

## 2017-07-04 NOTE — Progress Notes (Signed)
0827: Reported inability to obtain fetal heart rate via external monitor and doppler. Maternal BP elevated. Maryruth EveJaci Schmid, CNM requested to bedside.   Pt unable to maintain control, requiring much encouragement with cooperation for treatment. Bedside US attempted, but pt refused due to pain. Pt tossing in bed.  [id number]: Pt allowed sterile vaginal exam by Jaci, CNM. 5.5cm dilated.   0900: Pt allowed bedside ultra sound. Per CNM, no fetal heart beat found.  [id number]: Pt reports urge to push, began pushing with contractions, CNM present.   [id number]: Baby born with no signs of life.

## 2017-07-04 NOTE — Progress Notes (Signed)
1025w1d, G1/P0 Pt to L&D, [facility]. Pt obviously in pain, unable to maintain control rated 10/10 to lower abdomen. Reports pain has been present, getting worse since midnight 07/04/2017. Pt with history of drug use, elevated blood pressure, late prenatal care and noncompliance. Reports she felt baby moving as usual last night, but verbalizes being worried about movement today. CNM in department aware of pts arrival.

## 2017-07-05 DIAGNOSIS — O364XX0 Maternal care for intrauterine death, not applicable or unspecified: Secondary | ICD-10-CM

## 2017-07-05 DIAGNOSIS — O134 Gestational [pregnancy-induced] hypertension without significant proteinuria, complicating childbirth: Secondary | ICD-10-CM

## 2017-07-05 LAB — CBC
HCT: 40.1 % (ref 35.0–47.0)
Hemoglobin: 13.2 g/dL (ref 12.0–16.0)
MCH: 30.4 pg (ref 26.0–34.0)
MCHC: 33 g/dL (ref 32.0–36.0)
MCV: 92.3 fL (ref 80.0–100.0)
Platelets: 226 10*3/uL (ref 150–440)
RBC: 4.35 MIL/uL (ref 3.80–5.20)
RDW: 13.4 % (ref 11.5–14.5)
WBC: 15.8 10*3/uL — ABNORMAL HIGH (ref 3.6–11.0)

## 2017-07-05 LAB — LUPUS ANTICOAGULANT
DPT: 31.8 s (ref 0.0–55.0)
DRVVT: 28.3 s (ref 0.0–47.0)
PTT Lupus Anticoagulant: 38.4 s (ref 0.0–51.9)
Thrombin Time: 15.4 s (ref 0.0–23.0)
dPT Confirm Ratio: 1.17 Ratio (ref 0.00–1.40)

## 2017-07-05 LAB — FACTOR 2 ASSAY: FACTOR II ACTIVITY: 123 % (ref 50–154)

## 2017-07-05 LAB — RPR: RPR: NONREACTIVE

## 2017-07-05 LAB — HIV ANTIBODY (ROUTINE TESTING W REFLEX): HIV SCREEN 4TH GENERATION: NONREACTIVE

## 2017-07-05 LAB — SURGICAL PATHOLOGY

## 2017-07-05 MED ORDER — IBUPROFEN 600 MG PO TABS: 600.0000 mg | ORAL_TABLET | Freq: Four times a day (QID) | ORAL | 3 refills | Status: DC | PRN

## 2017-07-05 NOTE — Progress Notes (Signed)
   07/05/17 0945  Clinical Encounter Type  Visited With Health care provider;Patient not available  Visit Type Follow-up   Unit chaplain attempted follow-up.  Upon checking in with nurse, chaplain learned that patient had been awake most of the night and was currently sleeping.  Nurse reported that patient may page chaplain before her discharge.

## 2017-07-05 NOTE — Discharge Summary (Signed)
OB Discharge Summary     Patient Name: Karen Brooks DOB: 1990/10/10 MRN: [id number]  Date of admission: 07/04/2017 Delivering CNM: Oswaldo ConroyJacelyn Y Schmid, CNM  Date of Delivery: 07/04/2017  Date of discharge: 07/05/2017  Admitting diagnosis: Preterm labor Intrauterine pregnancy: 547w1d     Secondary diagnosis: Gestational Hypertension and non-compliant with care     Discharge diagnosis: Preterm P[facility]                         Hospital course:  Onset of Labor With Vaginal Delivery     27 y.o. yo G1P0100 at 557w1d was admitted in Active Labor on 07/04/2017. Patient had an uncomplicated labor course as follows:  Membrane Rupture Time/Date:   ,    Intrapartum Procedures: Episiotomy: None [1]                                         Lacerations:  None [1]  Patient had a delivery of a Non Viable infant. 07/04/2017  Information for the patient's newborn:  Karen Brooks, PendingBaby FD [[id number]]  Delivery Method: Vaginal, Spontaneous(Filed from Delivery Summary)    Pateint had an uncomplicated postpartum course.  She is ambulating, tolerating a regular diet, passing flatus, and urinating well. Patient is discharged home in stable condition on 07/05/17.                                                                  Post partum procedures:none  Complications: Stillborn  Physical exam on 07/05/2017: Vitals:   07/04/17 1816 07/04/17 1826 07/04/17 1925 07/04/17 2254  BP: (!) 149/88 (!) 157/87 (!) 152/92 (!) 150/99  Pulse: 80 81 86 91  Resp:   20 18  Temp:   98.1 F (36.7 C) 98.4 F (36.9 C)  TempSrc:   Oral Oral   General: alert, cooperative and no distress Lochia: appropriate Uterine Fundus: firm Incision: N/A DVT Evaluation: No evidence of DVT seen on physical exam.  Labs: Lab Results  Component Value Date   WBC 18.1 (H) 07/04/2017   HGB 14.4 07/04/2017   HCT 42.4 07/04/2017   MCV 91.9 07/04/2017   PLT 176 07/04/2017   CMP Latest Ref Rng & Units 07/04/2017   Glucose 65 - 99 mg/dL 90  BUN 6 - 20 mg/dL 7  Creatinine 3.320.44 - 9.511.00 mg/dL 8.840.46  Sodium 166135 - 063145 mmol/L 134(L)  Potassium 3.5 - 5.1 mmol/L 3.9  Chloride 101 - 111 mmol/L 108  CO2 22 - 32 mmol/L 19(L)  Calcium 8.9 - 10.3 mg/dL 8.3(L)  Total Protein 6.5 - 8.1 g/dL 6.6  Total Bilirubin 0.3 - 1.2 mg/dL 0.3  Alkaline Phos 38 - 126 U/L 117  AST 15 - 41 U/L 33  ALT 14 - 54 U/L 27    Discharge instruction: per After Visit Summary.  Medications:  Allergies as of 07/05/2017      Reactions   Latex Rash      Medication List    STOP taking these medications   calcium carbonate 750 MG chewable tablet Commonly known as:  TUMS EX     TAKE these medications   hydrOXYzine 25  MG tablet Commonly known as:  ATARAX/VISTARIL Take 1 tablet (25 mg total) by mouth every 6 (six) hours.   prenatal multivitamin Tabs tablet Take 1 tablet by mouth daily at 12 noon.       Diet: routine diet  Activity: Advance as tolerated. Pelvic rest for 6 weeks.   Outpatient follow up: Follow-up Information    Oswaldo Conroy, CNM. Schedule an appointment as soon as possible for a visit in 2 week(s).   Specialty:  Certified Nurse Midwife Why:  postpartum mood evaluation Contact information: [address] [place] [address] [phone]             Postpartum contraception: Undecided Rhogam Given postpartum: NA Rubella vaccine given postpartum: MMRx2 Varicella vaccine given postpartum: Varicella Immune TDaP given antepartum or postpartum: Yes given 05/29/2017  Newborn Data: Stillborn female  Birth Weight: 3 lb 7.7 oz (1580 g)  Newborn Delivery   Birth date/time:  07/04/2017 09:17:00 Delivery type:  Vaginal, Spontaneous     Disposition:morgue  SIGNED:  Tresea Mall, CNM

## 2017-07-05 NOTE — Progress Notes (Signed)
Patient discharged all instructions gone over with patient, verbalizes understanding and when to return to hospital

## 2017-07-05 NOTE — Progress Notes (Signed)
Patient has been resting all morning. Ha refused fundal checks and blood pressure checks, she just wants to be left alone.

## 2017-07-06 LAB — CARDIOLIPIN ANTIBODIES, IGG, IGM, IGA
ANTICARDIOLIPIN IGM: 11 [MPL'U]/mL (ref 0–12)
Anticardiolipin IgA: <9 APL U/mL (ref 0–11)

## 2017-07-07 ENCOUNTER — Telehealth: Payer: Self-pay | Admitting: Lactation Services

## 2017-07-07 NOTE — Telephone Encounter (Addendum)
Cathleen FearsStephanie Bucks called birthplace and was transferred to lactation.  She had a stillborn 3 days ago at 8 1/2 months.  Explained normal course of lactation regardless if had live birth.  Judeth CornfieldStephanie reports hard, warm, swollen, painful breasts. Judeth CornfieldStephanie felt like she ran a fever last night when breasts started filling.  No redness or streaks noted.  Condolences expressed to mom.  Mom became tearful.  Let mom talk about loss first.  Talked about support groups that are available.  Judeth CornfieldStephanie reports having a very supportive family unit.  Discussed options of pumping and possibly donating to milk bank or totally drying up milk.  Judeth CornfieldStephanie reports just wanting these hard, painful breasts to go away.  If she chose to dry up milk, discouraged any stimulation to the breast and not to express milk.  Explained how cold cabbage leaves and ice packs would suppress further production of milk.  Discussed how well-fitting supportive bra and/or binder might work.  Discussed how certain herbs, foods and sucking on peppermint altoids could assist with drying up milk.  Mom has 600 mg tablets of Motrin.  Explained how anti-infammatory could help with discomfort and swelling.  Discussed differences in engorgement which should resolve in 24 to 72 hours as opposed to mastitis, which could require antibiotics.  Signs and symptoms given for each condition.  Encouraged mom to call obstetrician if symptoms did not resolve or worsened.  Lactation name and number given and encouraged to call for any further questions, concerns or assistance.

## 2017-07-09 LAB — FACTOR 5 LEIDEN

## 2017-07-11 ENCOUNTER — Encounter: Payer: Self-pay | Admitting: Emergency Medicine

## 2017-07-11 ENCOUNTER — Emergency Department
Admission: EM | Admit: 2017-07-11 | Discharge: 2017-07-11 | Disposition: A | Discharge status: Home / Self Care | Payer: Medicaid Other | Attending: Emergency Medicine | Admitting: Emergency Medicine

## 2017-07-11 ENCOUNTER — Emergency Department: Payer: Medicaid Other

## 2017-07-11 ENCOUNTER — Other Ambulatory Visit: Payer: Self-pay

## 2017-07-11 DIAGNOSIS — Z79899 Other long term (current) drug therapy: Secondary | ICD-10-CM | POA: Diagnosis not present

## 2017-07-11 DIAGNOSIS — R531 Weakness: Secondary | ICD-10-CM

## 2017-07-11 DIAGNOSIS — N390 Urinary tract infection, site not specified: Secondary | ICD-10-CM | POA: Insufficient documentation

## 2017-07-11 DIAGNOSIS — R930 Abnormal findings on diagnostic imaging of skull and head, not elsewhere classified: Secondary | ICD-10-CM | POA: Diagnosis not present

## 2017-07-11 DIAGNOSIS — R202 Paresthesia of skin: Secondary | ICD-10-CM | POA: Diagnosis not present

## 2017-07-11 DIAGNOSIS — Z91040 Latex allergy status: Secondary | ICD-10-CM | POA: Diagnosis not present

## 2017-07-11 DIAGNOSIS — F17210 Nicotine dependence, cigarettes, uncomplicated: Secondary | ICD-10-CM | POA: Insufficient documentation

## 2017-07-11 LAB — URINALYSIS, COMPLETE (UACMP) WITH MICROSCOPIC
Bacteria, UA: NONE SEEN
Bilirubin Urine: NEGATIVE
Glucose, UA: NEGATIVE mg/dL
KETONES UR: NEGATIVE mg/dL
Nitrite: POSITIVE — AB
Protein, ur: NEGATIVE mg/dL
Specific Gravity, Urine: 1.018 (ref 1.005–1.030)
pH: 7 (ref 5.0–8.0)

## 2017-07-11 LAB — BASIC METABOLIC PANEL
ANION GAP: 9 (ref 5–15)
BUN: 10 mg/dL (ref 6–20)
CALCIUM: 8.4 mg/dL — AB (ref 8.9–10.3)
CO2: 19 mmol/L — ABNORMAL LOW (ref 22–32)
Chloride: 112 mmol/L — ABNORMAL HIGH (ref 101–111)
Creatinine, Ser: 0.67 mg/dL (ref 0.44–1.00)
Glucose, Bld: 94 mg/dL (ref 65–99)
Potassium: 3.9 mmol/L (ref 3.5–5.1)
SODIUM: 140 mmol/L (ref 135–145)

## 2017-07-11 LAB — HEPATIC FUNCTION PANEL
ALT: 34 U/L (ref 14–54)
AST: 37 U/L (ref 15–41)
Albumin: 3.3 g/dL — ABNORMAL LOW (ref 3.5–5.0)
Alkaline Phosphatase: 117 U/L (ref 38–126)
Total Bilirubin: 0.4 mg/dL (ref 0.3–1.2)
Total Protein: 7.5 g/dL (ref 6.5–8.1)

## 2017-07-11 LAB — URINE DRUG SCREEN, QUALITATIVE (ARMC ONLY)
Amphetamines, Ur Screen: NOT DETECTED
BENZODIAZEPINE, UR SCRN: NOT DETECTED
Barbiturates, Ur Screen: NOT DETECTED
CANNABINOID 50 NG, UR ~~LOC~~: NOT DETECTED
Cocaine Metabolite,Ur ~~LOC~~: POSITIVE — AB
MDMA (Ecstasy)Ur Screen: NOT DETECTED
METHADONE SCREEN, URINE: NOT DETECTED
OPIATE, UR SCREEN: NOT DETECTED
PHENCYCLIDINE (PCP) UR S: NOT DETECTED
Tricyclic, Ur Screen: NOT DETECTED

## 2017-07-11 LAB — CBC
HCT: 46.8 % (ref 35.0–47.0)
HEMOGLOBIN: 15.5 g/dL (ref 12.0–16.0)
MCH: 30.3 pg (ref 26.0–34.0)
MCHC: 33.2 g/dL (ref 32.0–36.0)
MCV: 91.2 fL (ref 80.0–100.0)
Platelets: 464 10*3/uL — ABNORMAL HIGH (ref 150–440)
RBC: 5.14 MIL/uL (ref 3.80–5.20)
RDW: 13 % (ref 11.5–14.5)
WBC: 16 10*3/uL — ABNORMAL HIGH (ref 3.6–11.0)

## 2017-07-11 LAB — GLUCOSE, CAPILLARY: Glucose-Capillary: 87 mg/dL (ref 65–99)

## 2017-07-11 LAB — PROTEIN / CREATININE RATIO, URINE
Creatinine, Urine: 98 mg/dL
Total Protein, Urine: <6 mg/dL

## 2017-07-11 MED ORDER — CEPHALEXIN 500 MG PO CAPS: 500.0000 mg | ORAL_CAPSULE | Freq: Four times a day (QID) | ORAL | 0 refills | Status: AC

## 2017-07-11 MED ORDER — DIPHENHYDRAMINE HCL 50 MG/ML IJ SOLN
25.0000 mg | Freq: Once | INTRAMUSCULAR | Status: AC
  Administered 2017-07-11: 25 mg via INTRAVENOUS
  Filled 2017-07-11: qty 1

## 2017-07-11 MED ORDER — CEFTRIAXONE SODIUM 1 G IJ SOLR
1.0000 g | Freq: Once | INTRAMUSCULAR | Status: AC
  Administered 2017-07-11: 1 g via INTRAVENOUS

## 2017-07-11 MED ORDER — SODIUM CHLORIDE 0.9 % IV BOLUS (SEPSIS)
1000.0000 mL | Freq: Once | INTRAVENOUS | Status: AC
  Administered 2017-07-11: 1000 mL via INTRAVENOUS

## 2017-07-11 MED ORDER — KETOROLAC TROMETHAMINE 30 MG/ML IJ SOLN
30.0000 mg | Freq: Once | INTRAMUSCULAR | Status: AC
  Administered 2017-07-11: 30 mg via INTRAVENOUS

## 2017-07-11 MED ORDER — SODIUM CHLORIDE 0.9 % IV SOLN
INTRAVENOUS | Status: AC
  Administered 2017-07-11: 1 g via INTRAVENOUS
  Filled 2017-07-11: qty 10

## 2017-07-11 MED ORDER — SERTRALINE HCL 50 MG PO TABS: 50.0000 mg | ORAL_TABLET | Freq: Every day | ORAL | 0 refills | Status: DC

## 2017-07-11 MED ORDER — KETOROLAC TROMETHAMINE 30 MG/ML IJ SOLN
INTRAMUSCULAR | Status: AC
  Filled 2017-07-11: qty 1

## 2017-07-11 MED ORDER — METOCLOPRAMIDE HCL 5 MG/ML IJ SOLN
10.0000 mg | Freq: Once | INTRAMUSCULAR | Status: AC
  Administered 2017-07-11: 10 mg via INTRAVENOUS
  Filled 2017-07-11: qty 2

## 2017-07-11 MED ORDER — GADOBENATE DIMEGLUMINE 529 MG/ML IV SOLN
15.0000 mL | Freq: Once | INTRAVENOUS | Status: AC | PRN
  Administered 2017-07-11: 13 mL via INTRAVENOUS

## 2017-07-11 NOTE — ED Notes (Signed)
Pt c/o 8/10 neck pain. 

## 2017-07-11 NOTE — ED Notes (Signed)
Neurologist at bedside. 

## 2017-07-11 NOTE — ED Notes (Signed)
OBGYN at bedside

## 2017-07-11 NOTE — ED Notes (Signed)
ED Provider at bedside. 

## 2017-07-11 NOTE — Consult Note (Signed)
Referring Physician: Shaune Pollack    Chief Complaint: Left sided numbness  HPI: Karen Brooks is an 27 y.o. female s/p still birth by vaginal delivery on 3/14 who reports that last evening while outside she had acute onset of dizziness and left sided numbness.  She was hopeful that these symptoms would resolve but when they continued today patient presented for evaluation.  Initial NIHSS of 1.   Patient with gestational hypertension, history of polysubstance abuse.    Date last known well: Date: 07/09/2017 Time last known well: Time: 21:00 tPA Given: No: Outside time window  Past Medical History:  Diagnosis Date  . Chronic hepatitis C (HCC)   . Gestational hypertension   . Polysubstance abuse (HCC)    MJ, cocaine, heroin  . Tobacco use     Past Surgical History:  Procedure Laterality Date  . EXTERNAL EAR SURGERY    . WISDOM TOOTH EXTRACTION      Family History  Problem Relation Age of Onset  . Healthy Father   . Asthma Maternal Grandmother   . COPD Paternal Grandmother    Social History:  reports that she has been smoking cigarettes.  She has been smoking about 0.50 packs per day. She has never used smokeless tobacco. She reports that she drinks alcohol. She reports that she has current or past drug history. Drugs: Marijuana, Cocaine, and Heroin.  Allergies:  Allergies  Allergen Reactions  . Latex Rash    Medications: I have reviewed the patient's current medications. Prior to Admission:  Prior to Admission medications   Medication Sig Start Date End Date Taking? Authorizing Provider  hydrOXYzine (ATARAX/VISTARIL) 25 MG tablet Take 1 tablet (25 mg total) by mouth every 6 (six) hours. 06/14/17  Yes Farrel Conners, CNM  Prenatal Vit-Fe Fumarate-FA (PRENATAL MULTIVITAMIN) TABS tablet Take 1 tablet by mouth daily at 12 noon. 06/14/17  Yes Farrel Conners, CNM  ibuprofen (ADVIL,MOTRIN) 600 MG tablet Take 1 tablet (600 mg total) by mouth every 6 (six) hours as needed.  07/05/17   Tresea Mall, CNM    ROS: History obtained from the patient  General ROS: negative for - chills, fatigue, fever, night sweats, weight gain or weight loss Psychological ROS: negative for - behavioral disorder, hallucinations, memory difficulties, mood swings or suicidal ideation Ophthalmic ROS: negative for - blurry vision, double vision, eye pain or loss of vision ENT ROS: negative for - epistaxis, nasal discharge, oral lesions, sore throat, tinnitus  Allergy and Immunology ROS: negative for - hives or itchy/watery eyes Hematological and Lymphatic ROS: negative for - bleeding problems, bruising or swollen lymph nodes Endocrine ROS: negative for - galactorrhea, hair pattern changes, polydipsia/polyuria or temperature intolerance Respiratory ROS: negative for - cough, hemoptysis, shortness of breath or wheezing Cardiovascular ROS: negative for - chest pain, dyspnea on exertion, edema or irregular heartbeat Gastrointestinal ROS: negative for - abdominal pain, diarrhea, hematemesis, nausea/vomiting or stool incontinence Genito-Urinary ROS: negative for - dysuria, hematuria, incontinence or urinary frequency/urgency Musculoskeletal ROS: negative for - joint swelling or muscular weakness Neurological ROS: as noted in HPI Dermatological ROS: negative for rash and skin lesion changes  Physical Examination: Blood pressure 115/74, pulse 74, temperature 97.7 F (36.5 C), temperature source Oral, resp. rate 18, height 5\' 4"  (1.626 m), weight 66.2 kg (146 lb), last menstrual period 10/19/2016, SpO2 100 %.  HEENT-  Normocephalic, no lesions, without obvious abnormality.  Normal external eye and conjunctiva.  Normal TM's bilaterally.  Normal auditory canals and external ears. Normal external nose, mucus membranes  and septum.  Normal pharynx. Cardiovascular- S1, S2 normal, pulses palpable throughout   Lungs- chest clear, no wheezing, rales, normal symmetric air entry Abdomen- soft,  non-tender; bowel sounds normal; no masses,  no organomegaly Extremities- no edema Lymph-no adenopathy palpable Musculoskeletal-no joint tenderness, deformity or swelling Skin-warm and dry, no hyperpigmentation, vitiligo, or suspicious lesions  Neurological Examination   Mental Status: Alert, oriented, thought content appropriate.  Speech fluent without evidence of aphasia.  Able to follow 3 step commands without difficulty. Cranial Nerves: II: Discs flat bilaterally; Visual fields grossly normal, pupils equal, round, reactive to light and accommodation III,IV, VI: ptosis not present, extra-ocular motions intact bilaterally V,VII: smile symmetric, facial light touch sensation decreased on the left VIII: hearing normal bilaterally IX,X: gag reflex present XI: bilateral shoulder shrug XII: midline tongue extension Motor: Right : Upper extremity   5/5    Left:     Upper extremity   5/5  Lower extremity   5/5     Lower extremity   5/5 Tone and bulk:normal tone throughout; no atrophy noted Sensory: Pinprick and light touch decreased in the LLE Deep Tendon Reflexes: 2+ and symmetric throughout Plantars: Right: upgoing   Left: upgoing Cerebellar: Normal finger-to-nose and normal heel-to-shin testing bilaterally Gait: not tested due to safety concerns   Laboratory Studies:  Basic Metabolic Panel: Recent Labs  Lab 07/11/17 1111  NA 140  K 3.9  CL 112*  CO2 19*  GLUCOSE 94  BUN 10  CREATININE 0.67  CALCIUM 8.4*    Liver Function Tests: No results for input(s): AST, ALT, ALKPHOS, BILITOT, PROT, ALBUMIN in the last 168 hours. No results for input(s): LIPASE, AMYLASE in the last 168 hours. No results for input(s): AMMONIA in the last 168 hours.  CBC: Recent Labs  Lab 07/05/17 1141 07/11/17 1111  WBC 15.8* 16.0*  HGB 13.2 15.5  HCT 40.1 46.8  MCV 92.3 91.2  PLT 226 464*    Cardiac Enzymes: No results for input(s): CKTOTAL, CKMB, CKMBINDEX, TROPONINI in the last 168  hours.  BNP: Invalid input(s): POCBNP  CBG: Recent Labs  Lab 07/11/17 1109  GLUCAP 87    Microbiology: No results found for this or any previous visit.  Coagulation Studies: No results for input(s): LABPROT, INR in the last 72 hours.  Urinalysis: No results for input(s): COLORURINE, LABSPEC, PHURINE, GLUCOSEU, HGBUR, BILIRUBINUR, KETONESUR, PROTEINUR, UROBILINOGEN, NITRITE, LEUKOCYTESUR in the last 168 hours.  Invalid input(s): APPERANCEUR  Lipid Panel: No results found for: CHOL, TRIG, HDL, CHOLHDL, VLDL, LDLCALC  HgbA1C:  Lab Results  Component Value Date   HGBA1C 4.5 (L) 07/04/2017    Urine Drug Screen:      Component Value Date/Time   LABOPIA NONE DETECTED 07/04/2017 0939   COCAINSCRNUR NONE DETECTED 07/04/2017 0939   LABBENZ NONE DETECTED 07/04/2017 0939   AMPHETMU NONE DETECTED 07/04/2017 0939   THCU NONE DETECTED 07/04/2017 0939   LABBARB NONE DETECTED 07/04/2017 0939    Alcohol Level: No results for input(s): ETH in the last 168 hours.  Other results: EKG: normal sinus rhythm at 88 bpm.  Imaging: Ct Head Wo Contrast  Result Date: 07/11/2017 CLINICAL DATA:  Left-side weakness beginning last night. Difficulty walking. Left facial tingling. EXAM: CT HEAD WITHOUT CONTRAST TECHNIQUE: Contiguous axial images were obtained from the base of the skull through the vertex without intravenous contrast. COMPARISON:  Head CT 08/22/2007. FINDINGS: Brain: No evidence of acute infarction, hemorrhage, hydrocephalus, extra-axial collection or mass lesion/mass effect. Vascular: No hyperdense vessel or unexpected  calcification. Skull: Normal. Negative for fracture or focal lesion. Sinuses/Orbits: No acute finding. Other: None. IMPRESSION: Normal head CT. Electronically Signed   By: Drusilla Kanner M.D.   On: 07/11/2017 11:43    Assessment: 27 y.o. female with a history of polysubstance abuse and gestational hypertension who presents with left sided numbness one week after a  vaginal delivery of a still birth.  BP is elevated therefore must consider pre-ecclampsia.  Also can not rule out a hypercoagulable state.  Further imagine indicated.    Stroke Risk Factors - hypertension and smoking  Plan: 1. Urine for UDS and for evaluation of possible pre-ecclampsia 2. MRI, MRA, MRV of the brain  3. NPO until RN stroke swallow screen 4. Telemetry monitoring 5. Frequent neuro checks   Case discussed with Dr. Zenaida Deed, MD Neurology [phone] 07/11/2017, 1:37 PM

## 2017-07-11 NOTE — Consult Note (Signed)
Reason for Consult:Rule out preeclampsia Referring Physician: ER  Karen Brooks is an 27 y.o. female.  HPI: Patient presented to the ER complaining of dizziness and left sided tingling and numbness. Patient was evaluated for Stroke by MRA and CT both of which were negative. I was consulted to help evaluate patient for preeclampsia. I spoke with patient I the ER. She is doing well and was laying in the bed resting. Her significant other was present at bedside. The patient reported that she last used cocaine two days ago. Partner adds that the patient has also been drinking alcohol heavily. She says that she has been depressed and has had a difficult week since her baby was born still born. She has been having trouble sleeping. She denies headache, vision changes, and right upper quadrant pain. She tells me that she does plan to enter rehab tomorrow for drug abuse. She also told me that she has been set up with a grief counselor through the health department. She would like to start an antidepressant.   Past Medical History:  Diagnosis Date  . Chronic hepatitis C ([place])   . Gestational hypertension   . Polysubstance abuse ([place])    MJ, cocaine, heroin  . Tobacco use     Past Surgical History:  Procedure Laterality Date  . EXTERNAL EAR SURGERY    . WISDOM TOOTH EXTRACTION      Family History  Problem Relation Age of Onset  . Healthy Father   . Asthma Maternal Grandmother   . COPD Paternal Grandmother     Social History:  reports that she has been smoking cigarettes.  She has been smoking about 0.50 packs per day. She has never used smokeless tobacco. She reports that she drinks alcohol. She reports that she has current or past drug history. Drugs: Marijuana, Cocaine, and Heroin.  Allergies:  Allergies  Allergen Reactions  . Latex Rash    Medications: I have reviewed the patient's current medications.  Results for orders placed or performed during the hospital encounter of  07/11/17 (from the past 48 hour(s))  Glucose, capillary     Status: None   Collection Time: 07/11/17 11:09 AM  Result Value Ref Range   Glucose-Capillary 87 65 - 99 mg/dL  Basic metabolic panel     Status: Abnormal   Collection Time: 07/11/17 11:11 AM  Result Value Ref Range   Sodium 140 135 - 145 mmol/L   Potassium 3.9 3.5 - 5.1 mmol/L   Chloride 112 (H) 101 - 111 mmol/L   CO2 19 (L) 22 - 32 mmol/L   Glucose, Bld 94 65 - 99 mg/dL   BUN 10 6 - 20 mg/dL   Creatinine, Ser 0.67 0.44 - 1.00 mg/dL   Calcium 8.4 (L) 8.9 - 10.3 mg/dL   GFR calc non Af Amer >60 >60 mL/min   GFR calc Af Amer >60 >60 mL/min    Comment: (NOTE) The eGFR has been calculated using the CKD EPI equation. This calculation has not been validated in all clinical situations. eGFR's persistently <60 mL/min signify possible Chronic Kidney Disease.    Anion gap 9 5 - 15    Comment: Performed at [facility], [address] [place], [place] [address]  CBC     Status: Abnormal   Collection Time: 07/11/17 11:11 AM  Result Value Ref Range   WBC 16.0 (H) 3.6 - 11.0 K/uL   RBC 5.14 3.80 - 5.20 MIL/uL   Hemoglobin 15.5 12.0 - 16.0 g/dL  HCT 46.8 35.0 - 47.0 %   MCV 91.2 80.0 - 100.0 fL   MCH 30.3 26.0 - 34.0 pg   MCHC 33.2 32.0 - 36.0 g/dL   RDW 13.0 11.5 - 14.5 %   Platelets 464 (H) 150 - 440 K/uL    Comment: Performed at [facility], [address] [place], [place] [address]  Hepatic function panel     Status: Abnormal   Collection Time: 07/11/17 11:11 AM  Result Value Ref Range   Total Protein 7.5 6.5 - 8.1 g/dL   Albumin 3.3 (L) 3.5 - 5.0 g/dL   AST 37 15 - 41 U/L   ALT 34 14 - 54 U/L   Alkaline Phosphatase 117 38 - 126 U/L   Total Bilirubin 0.4 0.3 - 1.2 mg/dL   Bilirubin, Direct <0.1 (L) 0.1 - 0.5 mg/dL   Indirect Bilirubin NOT CALCULATED 0.3 - 0.9 mg/dL    Comment: Performed at [facility], [address] [place], [place] [address]  Urinalysis, Complete w Microscopic      Status: Abnormal   Collection Time: 07/11/17  3:04 PM  Result Value Ref Range   Color, Urine YELLOW (A) YELLOW   APPearance HAZY (A) CLEAR   Specific Gravity, Urine 1.018 1.005 - 1.030   pH 7.0 5.0 - 8.0   Glucose, UA NEGATIVE NEGATIVE mg/dL   Hgb urine dipstick SMALL (A) NEGATIVE   Bilirubin Urine NEGATIVE NEGATIVE   Ketones, ur NEGATIVE NEGATIVE mg/dL   Protein, ur NEGATIVE NEGATIVE mg/dL   Nitrite POSITIVE (A) NEGATIVE   Leukocytes, UA SMALL (A) NEGATIVE   RBC / HPF 0-5 0 - 5 RBC/hpf   WBC, UA 6-30 0 - 5 WBC/hpf   Bacteria, UA NONE SEEN NONE SEEN   Squamous Epithelial / LPF 0-5 (A) NONE SEEN   Mucus PRESENT     Comment: Performed at [facility], [address], [place], [place] [address]  Urine Drug Screen, Qualitative ([facility] only)     Status: Abnormal   Collection Time: 07/11/17  3:04 PM  Result Value Ref Range   Tricyclic, Ur Screen NONE DETECTED NONE DETECTED   Amphetamines, Ur Screen NONE DETECTED NONE DETECTED   MDMA (Ecstasy)Ur Screen NONE DETECTED NONE DETECTED   Cocaine Metabolite,Ur [place] POSITIVE (A) NONE DETECTED   Opiate, Ur Screen NONE DETECTED NONE DETECTED   Phencyclidine (PCP) Ur S NONE DETECTED NONE DETECTED   Cannabinoid 50 Ng, Ur  NONE DETECTED NONE DETECTED   Barbiturates, Ur Screen NONE DETECTED NONE DETECTED   Benzodiazepine, Ur Scrn NONE DETECTED NONE DETECTED   Methadone Scn, Ur NONE DETECTED NONE DETECTED    Comment: (NOTE) Tricyclics + metabolites, urine    Cutoff 1000 ng/mL Amphetamines + metabolites, urine  Cutoff 1000 ng/mL MDMA (Ecstasy), urine              Cutoff 500 ng/mL Cocaine Metabolite, urine          Cutoff 300 ng/mL Opiate + metabolites, urine        Cutoff 300 ng/mL Phencyclidine (PCP), urine         Cutoff 25 ng/mL Cannabinoid, urine                 Cutoff 50 ng/mL Barbiturates + metabolites, urine  Cutoff 200 ng/mL Benzodiazepine, urine              Cutoff 200 ng/mL Methadone, urine  Cutoff 300  ng/mL The urine drug screen provides only a preliminary, unconfirmed analytical test result and should not be used for non-medical purposes. Clinical consideration and professional judgment should be applied to any positive drug screen result due to possible interfering substances. A more specific alternate chemical method must be used in order to obtain a confirmed analytical result. Gas chromatography / mass spectrometry (GC/MS) is the preferred confirmat ory method. Performed at [facility], [place], [place] [address]     Ct Head Wo Contrast  Result Date: 07/11/2017 CLINICAL DATA:  Left-side weakness beginning last night. Difficulty walking. Left facial tingling. EXAM: CT HEAD WITHOUT CONTRAST TECHNIQUE: Contiguous axial images were obtained from the base of the skull through the vertex without intravenous contrast. COMPARISON:  Head CT 08/22/2007. FINDINGS: Brain: No evidence of acute infarction, hemorrhage, hydrocephalus, extra-axial collection or mass lesion/mass effect. Vascular: No hyperdense vessel or unexpected calcification. Skull: Normal. Negative for fracture or focal lesion. Sinuses/Orbits: No acute finding. Other: None. IMPRESSION: Normal head CT. Electronically Signed   By: Inge Rise M.D.   On: 07/11/2017 11:43   Mr Angiogram Head Wo Contrast  Result Date: 07/11/2017 CLINICAL DATA:  Left-sided weakness beginning last night. Unable to walk. Tingling to the left side of the face. EXAM: MRI HEAD WITHOUT AND WITH CONTRAST MRA HEAD WITHOUT CONTRAST MRA NECK WITHOUT AND WITH CONTRAST MRV HEAD WITHOUT CONTRAST TECHNIQUE: Multiplanar, multiecho pulse sequences of the brain and surrounding structures were obtained without intravenous contrast. Angiographic images of the Circle of Willis were obtained using MRA technique without intravenous contrast. Angiographic images of the neck were obtained using MRA technique without and with intravenous contrast.  Carotid stenosis measurements (when applicable) are obtained utilizing NASCET criteria, using the distal internal carotid diameter as the denominator. Angiographic images of the venous structures of the head were obtained using MRV technique without IV contrast. CONTRAST:  82m MULTIHANCE GADOBENATE DIMEGLUMINE 529 MG/ML IV SOLN COMPARISON:  CT head without contrast 07/11/2017 FINDINGS: MRI HEAD FINDINGS Brain: The diffusion-weighted images demonstrate no acute or subacute infarction. Acute hemorrhage or mass lesion is present. The ventricles are of normal size. No significant white matter disease is present. The internal auditory canals are within normal limits bilaterally. Brainstem and cerebellum are normal. Vascular: Flow is present in the major intracranial arteries. Skull and upper cervical spine: The skull base is within normal limits. The craniocervical junction is normal. The upper cervical spine is within normal limits. Sinuses/Orbits: A polyp or mucous retention cyst is present within the right sphenoid sinus. The paranasal sinuses and mastoid air cells are otherwise clear. Globes and orbits are within normal limits. MRA HEAD FINDINGS The internal carotid artery is are within normal limits from the high cervical segments through the ICA termini. 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 Both A2 segments fill from the left. M1 segments are within normal limits. The MCA bifurcations are intact. ACA and MCA branch vessels are within normal limits. The left vertebral artery essentially ends at the PICA. The right PICA is visualized and normal. The basilar artery is normal. Both posterior cerebral arteries originate from the basilar tip. There is no significant proximal stenosis, aneurysm, or branch vessel occlusion. MRA NECK FINDINGS The time-of-flight images demonstrate no significant flow disturbance. Flow is antegrade in the vertebral arteries bilaterally. There is a common origin of the left common carotid artery  in the innominate artery. The right vertebral artery is the dominant vessel. Carotid bifurcations are intact bilaterally. The vertebral  arteries are within normal limits. MRV HEAD FINDINGS Time-of-flight images demonstrate normal patency of the dural sinuses. The right transverse sinus is dominant. The straight sinus and deep cerebral veins are intact. Cortical veins are unremarkable. IMPRESSION: 1. Normal MRI appearance of the brain. 2. Polyp or mucous retention cyst within the posterior medial right sphenoid sinus. 3. Normal variant MRA circle-of-Willis without significant proximal stenosis, aneurysm, or branch vessel occlusion. MRA neck within normal limits. No significant stenosis or occlusion. 4. MR venogram of the head demonstrates normal patency of the dural sinuses and deep cerebral veins. Electronically Signed   By: San Morelle M.D.   On: 07/11/2017 15:03   Mr Angiogram Neck W Or Wo Contrast  Result Date: 07/11/2017 CLINICAL DATA:  Left-sided weakness beginning last night. Unable to walk. Tingling to the left side of the face. EXAM: MRI HEAD WITHOUT AND WITH CONTRAST MRA HEAD WITHOUT CONTRAST MRA NECK WITHOUT AND WITH CONTRAST MRV HEAD WITHOUT CONTRAST TECHNIQUE: Multiplanar, multiecho pulse sequences of the brain and surrounding structures were obtained without intravenous contrast. Angiographic images of the Circle of Willis were obtained using MRA technique without intravenous contrast. Angiographic images of the neck were obtained using MRA technique without and with intravenous contrast. Carotid stenosis measurements (when applicable) are obtained utilizing NASCET criteria, using the distal internal carotid diameter as the denominator. Angiographic images of the venous structures of the head were obtained using MRV technique without IV contrast. CONTRAST:  59m MULTIHANCE GADOBENATE DIMEGLUMINE 529 MG/ML IV SOLN COMPARISON:  CT head without contrast 07/11/2017 FINDINGS: MRI HEAD FINDINGS  Brain: The diffusion-weighted images demonstrate no acute or subacute infarction. Acute hemorrhage or mass lesion is present. The ventricles are of normal size. No significant white matter disease is present. The internal auditory canals are within normal limits bilaterally. Brainstem and cerebellum are normal. Vascular: Flow is present in the major intracranial arteries. Skull and upper cervical spine: The skull base is within normal limits. The craniocervical junction is normal. The upper cervical spine is within normal limits. Sinuses/Orbits: A polyp or mucous retention cyst is present within the right sphenoid sinus. The paranasal sinuses and mastoid air cells are otherwise clear. Globes and orbits are within normal limits. MRA HEAD FINDINGS The internal carotid artery is are within normal limits from the high cervical segments through the ICA termini. 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 Both A2 segments fill from the left. M1 segments are within normal limits. The MCA bifurcations are intact. ACA and MCA branch vessels are within normal limits. The left vertebral artery essentially ends at the PICA. The right PICA is visualized and normal. The basilar artery is normal. Both posterior cerebral arteries originate from the basilar tip. There is no significant proximal stenosis, aneurysm, or branch vessel occlusion. MRA NECK FINDINGS The time-of-flight images demonstrate no significant flow disturbance. Flow is antegrade in the vertebral arteries bilaterally. There is a common origin of the left common carotid artery in the innominate artery. The right vertebral artery is the dominant vessel. Carotid bifurcations are intact bilaterally. The vertebral arteries are within normal limits. MRV HEAD FINDINGS Time-of-flight images demonstrate normal patency of the dural sinuses. The right transverse sinus is dominant. The straight sinus and deep cerebral veins are intact. Cortical veins are unremarkable. IMPRESSION: 1.  Normal MRI appearance of the brain. 2. Polyp or mucous retention cyst within the posterior medial right sphenoid sinus. 3. Normal variant MRA circle-of-Willis without significant proximal stenosis, aneurysm, or branch vessel occlusion. MRA neck within normal limits. No  significant stenosis or occlusion. 4. MR venogram of the head demonstrates normal patency of the dural sinuses and deep cerebral veins. Electronically Signed   By: San Morelle M.D.   On: 07/11/2017 15:03   Mr Brain Wo Contrast  Result Date: 07/11/2017 CLINICAL DATA:  Left-sided weakness beginning last night. Unable to walk. Tingling to the left side of the face. EXAM: MRI HEAD WITHOUT AND WITH CONTRAST MRA HEAD WITHOUT CONTRAST MRA NECK WITHOUT AND WITH CONTRAST MRV HEAD WITHOUT CONTRAST TECHNIQUE: Multiplanar, multiecho pulse sequences of the brain and surrounding structures were obtained without intravenous contrast. Angiographic images of the Circle of Willis were obtained using MRA technique without intravenous contrast. Angiographic images of the neck were obtained using MRA technique without and with intravenous contrast. Carotid stenosis measurements (when applicable) are obtained utilizing NASCET criteria, using the distal internal carotid diameter as the denominator. Angiographic images of the venous structures of the head were obtained using MRV technique without IV contrast. CONTRAST:  60m MULTIHANCE GADOBENATE DIMEGLUMINE 529 MG/ML IV SOLN COMPARISON:  CT head without contrast 07/11/2017 FINDINGS: MRI HEAD FINDINGS Brain: The diffusion-weighted images demonstrate no acute or subacute infarction. Acute hemorrhage or mass lesion is present. The ventricles are of normal size. No significant white matter disease is present. The internal auditory canals are within normal limits bilaterally. Brainstem and cerebellum are normal. Vascular: Flow is present in the major intracranial arteries. Skull and upper cervical spine: The skull  base is within normal limits. The craniocervical junction is normal. The upper cervical spine is within normal limits. Sinuses/Orbits: A polyp or mucous retention cyst is present within the right sphenoid sinus. The paranasal sinuses and mastoid air cells are otherwise clear. Globes and orbits are within normal limits. MRA HEAD FINDINGS The internal carotid artery is are within normal limits from the high cervical segments through the ICA termini. 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 Both A2 segments fill from the left. M1 segments are within normal limits. The MCA bifurcations are intact. ACA and MCA branch vessels are within normal limits. The left vertebral artery essentially ends at the PICA. The right PICA is visualized and normal. The basilar artery is normal. Both posterior cerebral arteries originate from the basilar tip. There is no significant proximal stenosis, aneurysm, or branch vessel occlusion. MRA NECK FINDINGS The time-of-flight images demonstrate no significant flow disturbance. Flow is antegrade in the vertebral arteries bilaterally. There is a common origin of the left common carotid artery in the innominate artery. The right vertebral artery is the dominant vessel. Carotid bifurcations are intact bilaterally. The vertebral arteries are within normal limits. MRV HEAD FINDINGS Time-of-flight images demonstrate normal patency of the dural sinuses. The right transverse sinus is dominant. The straight sinus and deep cerebral veins are intact. Cortical veins are unremarkable. IMPRESSION: 1. Normal MRI appearance of the brain. 2. Polyp or mucous retention cyst within the posterior medial right sphenoid sinus. 3. Normal variant MRA circle-of-Willis without significant proximal stenosis, aneurysm, or branch vessel occlusion. MRA neck within normal limits. No significant stenosis or occlusion. 4. MR venogram of the head demonstrates normal patency of the dural sinuses and deep cerebral veins. Electronically  Signed   By: CSan MorelleM.D.   On: 07/11/2017 15:03   Mr Mrv Head Wo Cm  Result Date: 07/11/2017 CLINICAL DATA:  Left-sided weakness beginning last night. Unable to walk. Tingling to the left side of the face. EXAM: MRI HEAD WITHOUT AND WITH CONTRAST MRA HEAD WITHOUT CONTRAST MRA NECK WITHOUT AND  WITH CONTRAST MRV HEAD WITHOUT CONTRAST TECHNIQUE: Multiplanar, multiecho pulse sequences of the brain and surrounding structures were obtained without intravenous contrast. Angiographic images of the Circle of Willis were obtained using MRA technique without intravenous contrast. Angiographic images of the neck were obtained using MRA technique without and with intravenous contrast. Carotid stenosis measurements (when applicable) are obtained utilizing NASCET criteria, using the distal internal carotid diameter as the denominator. Angiographic images of the venous structures of the head were obtained using MRV technique without IV contrast. CONTRAST:  16m MULTIHANCE GADOBENATE DIMEGLUMINE 529 MG/ML IV SOLN COMPARISON:  CT head without contrast 07/11/2017 FINDINGS: MRI HEAD FINDINGS Brain: The diffusion-weighted images demonstrate no acute or subacute infarction. Acute hemorrhage or mass lesion is present. The ventricles are of normal size. No significant white matter disease is present. The internal auditory canals are within normal limits bilaterally. Brainstem and cerebellum are normal. Vascular: Flow is present in the major intracranial arteries. Skull and upper cervical spine: The skull base is within normal limits. The craniocervical junction is normal. The upper cervical spine is within normal limits. Sinuses/Orbits: A polyp or mucous retention cyst is present within the right sphenoid sinus. The paranasal sinuses and mastoid air cells are otherwise clear. Globes and orbits are within normal limits. MRA HEAD FINDINGS The internal carotid artery is are within normal limits from the high cervical  segments through the ICA termini. 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 Both A2 segments fill from the left. M1 segments are within normal limits. The MCA bifurcations are intact. ACA and MCA branch vessels are within normal limits. The left vertebral artery essentially ends at the PICA. The right PICA is visualized and normal. The basilar artery is normal. Both posterior cerebral arteries originate from the basilar tip. There is no significant proximal stenosis, aneurysm, or branch vessel occlusion. MRA NECK FINDINGS The time-of-flight images demonstrate no significant flow disturbance. Flow is antegrade in the vertebral arteries bilaterally. There is a common origin of the left common carotid artery in the innominate artery. The right vertebral artery is the dominant vessel. Carotid bifurcations are intact bilaterally. The vertebral arteries are within normal limits. MRV HEAD FINDINGS Time-of-flight images demonstrate normal patency of the dural sinuses. The right transverse sinus is dominant. The straight sinus and deep cerebral veins are intact. Cortical veins are unremarkable. IMPRESSION: 1. Normal MRI appearance of the brain. 2. Polyp or mucous retention cyst within the posterior medial right sphenoid sinus. 3. Normal variant MRA circle-of-Willis without significant proximal stenosis, aneurysm, or branch vessel occlusion. MRA neck within normal limits. No significant stenosis or occlusion. 4. MR venogram of the head demonstrates normal patency of the dural sinuses and deep cerebral veins. Electronically Signed   By: CSan MorelleM.D.   On: 07/11/2017 15:03    Review of Systems  Constitutional: Negative for chills, fever, malaise/fatigue and weight loss.  HENT: Negative for congestion, hearing loss and sinus pain.   Eyes: Negative for blurred vision and double vision.  Respiratory: Negative for cough, sputum production, shortness of breath and wheezing.   Cardiovascular: Negative for chest pain,  palpitations, orthopnea and leg swelling.  Gastrointestinal: Negative for abdominal pain, constipation, diarrhea, nausea and vomiting.  Genitourinary: Negative for dysuria, flank pain, frequency, hematuria and urgency.  Musculoskeletal: Negative for back pain, falls and joint pain.  Skin: Negative for itching and rash.  Neurological: Positive for dizziness and weakness. Negative for headaches.  Psychiatric/Behavioral: Negative for depression, substance abuse and suicidal ideas. The patient is not nervous/anxious.  Blood pressure 138/69, pulse 78, temperature 97.7 F (36.5 C), temperature source Oral, resp. rate 18, height 5' 4" (1.626 m), weight 146 lb (66.2 kg), last menstrual period 10/19/2016, SpO2 100 %. Physical Exam  Nursing note and vitals reviewed. Constitutional: She is oriented to person, place, and time. She appears well-developed and well-nourished.  HENT:  Head: Normocephalic and atraumatic.  Cardiovascular: Normal rate and regular rhythm.  Respiratory: Effort normal and breath sounds normal.  GI: Soft. Bowel sounds are normal.  Musculoskeletal: Normal range of motion. She exhibits no edema or deformity.  No swelling or edema.  Neurological: She is alert and oriented to person, place, and time. She has normal reflexes.  No clonus, normal patellar and brachial reflexes bilaterally.    Skin: Skin is warm and dry.  Psychiatric: She has a normal mood and affect. Her behavior is normal. Judgment and thought content normal.    Assessment/Plan:  27 yo with Elevated blood pressures and stroke-like symptoms.   Elevate blood pressures: Symptoms likely related cocaine and alcohol usage. Protein to creatinine ratio is pending. UA negative for protein. I do not recommend treatment with antihypertensive or IV magnesium at this time. Urinary tract infection: Treat with keflex outpatient. Depression: Zoloft 53m once daily, encouraged patient to follow up with counselor as  planned. Drug addition: Encouraged patient to seek therapy and rehabilitation .  Patient can follow up outpatient in 1 week with me in the office.   Christanna R Schuman 07/11/2017, 4:18 PM
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
## 2017-07-11 NOTE — ED Triage Notes (Signed)
Pt states she gave birth last week.

## 2017-07-11 NOTE — ED Triage Notes (Signed)
Pt states left sided weakness since last night, states she isn't able to walk well, states left side of face tingling, grips equal bilaterally, no arm drift speech clear, face symmetrical, denies any new medications, has never had this before.

## 2017-07-11 NOTE — ED Notes (Signed)
Pt alert and oriented X4, active, cooperative, pt in NAD. RR even and unlabored, color WNL.  Pt informed to return if any life threatening symptoms occur.  Discharge and followup instructions reviewed. Pt left with significant other and grandmother. Pt ambulates safely.

## 2017-07-11 NOTE — ED Notes (Signed)
Requesting that patient stay NPO until MRI is resulted.

## 2017-07-11 NOTE — ED Triage Notes (Signed)
Sent here from [facility], labs drawn this am.

## 2017-07-11 NOTE — Discharge Instructions (Signed)
follow up with your counselor next few days. Return here for any new or worsening symptoms. Do not use cocaine and any more. It may give you a stroke or otherwise harm you possibly even kill you. Take the Zoloft one pill a day for your depression. We can increase that dose later if need be. Take the Keflex one pill 4 times a day fto help treat your UTI.

## 2017-07-11 NOTE — ED Notes (Signed)
Pt had hive like appearing area to right forearm, approx 6 inches above IV site. Pt states that it itches. Reddened area gets better within 3 minutes.

## 2017-07-11 NOTE — ED Notes (Signed)
First Nurse Note:  Patient to ED from Dr. Dannielle Huhlmedo's office with new onset of numbness to left side of body and gait difficulty.  Recently had a stillbirth.  Elevated platelets and WBC per Dr. Zada Finderslmedo.  [facility], accompanied by family.  Pulled for next triage.

## 2017-07-11 NOTE — ED Provider Notes (Signed)
discussed patient with Dr. Thad Rangereynolds she says from her point of view of the MRA MRI and MRV of the brain are within normal limits the patient can go home she did want to make sure that OB/GYN was okay with that. I spoke with Dr. Gaynelle ArabianShuman OB/GYN who also came down to see the patient. She has no protein in her urine and blood pressure is coming down to 138 systolic she is concerned that the cocaine may be what is causing the hypertension. Patient is positive for cocaine today again. Patient does have detox scheduled and has a counselor for postpartum depression. Dr. Gaynelle ArabianShuman wants to start her on some antibiotics for her UTI will use Keflex and Zoloft 50 a day to start with she hasn't appointment with them in 6 weeks but they will see her dentist a week for blood pressure check. We are currently waiting on a protein to creatinine ratio. If this is less than 0.3 patient will be okay to go home with its higher than that call Dr. Gaynelle ArabianShuman back.   Arnaldo NatalMalinda, Karen F, MD 07/11/17 [phone]

## 2017-07-11 NOTE — ED Provider Notes (Signed)
patient's protein to creatinine ratio is so low with reportable. I will discharge her.   Arnaldo NatalMalinda, Branton Einstein F, MD 07/11/17 [id number]

## 2017-07-11 NOTE — ED Triage Notes (Signed)
Pt was 8 months pregnant and delivered stillborn last week.

## 2017-07-11 NOTE — ED Notes (Signed)
Pt up and walking around room, requesting more to drink. Given drink.

## 2017-07-11 NOTE — ED Notes (Addendum)
Pt back from MRI. Into patient room, patient eating pizza, wings and mountain dew. Requested urine sample, states that she will attempt in about 10 minutes.  Hives to right arm, gone at this time. Pt states decreased sensation continues to left leg and left side of face only.

## 2017-07-11 NOTE — ED Notes (Signed)
LKW: 2045 07/10/17

## 2017-07-11 NOTE — ED Notes (Signed)
Pt to MRI

## 2017-07-11 NOTE — ED Notes (Signed)
Dr Shaune PollackLord made aware of pt condition.

## 2017-07-14 LAB — URINE CULTURE

## 2017-07-16 NOTE — H&P (Signed)
Obstetrics Admission History & Physical   [facility] for abdominal pain, scheduled ultrasound not able to be completed.  HPI:  27 y.o. G1P0000 @ 2741w1d (08/21/2017, by Ultrasound). Admitted on 07/04/2017:   Patient Active Problem List   Diagnosis Date Noted  . Postpartum care following vaginal delivery 07/05/2017  . Preterm labor 07/04/2017  . Recurrent UTI 06/24/2017  . Elevated blood pressure affecting pregnancy in third trimester, antepartum 06/14/2017  . Gestational hypertension 06/14/2017  . Polysubstance abuse (HCC) 06/14/2017  . Chronic hepatitis C affecting antepartum care of mother [facility](HCC) 06/14/2017  . Tobacco use affecting pregnancy in first trimester, antepartum 06/14/2017  . Migraines 12/30/2012  . Fibrocystic breast 04/07/2012     Presents for abdominal pain, rates pain 10/10. Intermittent pains began last night around midnight and have increased in frequency and duration. No nausea, vomiting, or diarrhea. Denies loss of fluid and vaginal bleeding. Reports fetal movement around midnight but concerned about not feeling movements today.  Prenatal care: late and limited at [facility], [facility]. Pregnancy complicated by gestational HTN, drug use.  ROS: A review of systems was performed and negative, except as stated in the above HPI.  PMHx:  Past Medical History:  Diagnosis Date  . Chronic hepatitis C (HCC)   . Gestational hypertension   . Polysubstance abuse (HCC)    MJ, cocaine, heroin  . Tobacco use    PSHx:  Past Surgical History:  Procedure Laterality Date  . EXTERNAL EAR SURGERY    . WISDOM TOOTH EXTRACTION     Medications:  No medications prior to admission.   Allergies: is allergic to latex.   OBHx: G1P0000  FHx: Negative/unremarkable except as detailed in HPI.Marland Kitchen.  No family history of birth defects. Soc Hx: Recreational drug use: current: cocaine, tobacco  smoking: current  Objective:   BP: 155/102, P 76  Constitutional: Gravid female in acute distress.  HEENT: normal Skin: Warm and dry.  Cardiovascular: RRR   Extremity: trace to 1+ bilateral pedal edema Respiratory: Clear to auscultation bilaterally. Normal respiratory effort Abdomen: marked Neuro: DTRs 2+, Cranial nerves grossly intact Psych: Alert and Oriented x3. No memory deficits. Agitated mood and affect.  MS: normal gait, normal bilateral lower extremity ROM/strength/stability.  Pelvic exam: is not limited by body habitus External Genitalia, Bartholin's glands, Urethra, Skene's glands: within normal limits Vagina: within normal limits and with blood in the vault Cervix: 5.5 cm/90/-1, small bloody show Uterus: Spontaneous uterine activity  Adnexa: not evaluated  EFM: Unable to trace fetal heart on EFM. Bedside ultrasound showed no fetal cardiac activity.   Perinatal info:  Blood type: B positive Rubella- Unknown Varicella -Immune TDaP Given during third trimester of this pregnancy RPR NR / HIV Neg/ HBsAg Neg / Hep C carrier  Assessment & Plan:   27 y.o. G1P0100 @ 1541w1d, Admitted on 07/04/2017: preterm labor, gestational hypertension, IUFD.  Admit for labor and Observe for cervical change, anticipate SVD.  Marcelyn BruinsJacelyn Maysin Carstens, CNM [facility], [facility], [facility] 07/04/2017

## 2017-07-19 ENCOUNTER — Ambulatory Visit: Payer: Medicaid Other | Admitting: Obstetrics and Gynecology

## 2017-07-22 ENCOUNTER — Ambulatory Visit: Payer: Medicaid Other | Admitting: Obstetrics and Gynecology

## 2017-08-02 ENCOUNTER — Ambulatory Visit: Payer: Medicaid Other

## 2017-08-02 ENCOUNTER — Encounter: Payer: Self-pay | Admitting: Emergency Medicine

## 2017-08-02 ENCOUNTER — Other Ambulatory Visit: Payer: Self-pay

## 2017-08-02 ENCOUNTER — Ambulatory Visit
Admission: EM | Admit: 2017-08-02 | Discharge: 2017-08-02 | Disposition: A | Discharge status: Home / Self Care | Payer: Medicaid Other | Attending: Family Medicine | Admitting: Family Medicine

## 2017-08-02 DIAGNOSIS — Z91040 Latex allergy status: Secondary | ICD-10-CM | POA: Diagnosis not present

## 2017-08-02 DIAGNOSIS — S20212A Contusion of left front wall of thorax, initial encounter: Secondary | ICD-10-CM

## 2017-08-02 DIAGNOSIS — Z791 Long term (current) use of non-steroidal anti-inflammatories (NSAID): Secondary | ICD-10-CM | POA: Insufficient documentation

## 2017-08-02 DIAGNOSIS — S60222A Contusion of left hand, initial encounter: Secondary | ICD-10-CM | POA: Insufficient documentation

## 2017-08-02 DIAGNOSIS — Z825 Family history of asthma and other chronic lower respiratory diseases: Secondary | ICD-10-CM | POA: Diagnosis not present

## 2017-08-02 DIAGNOSIS — F17210 Nicotine dependence, cigarettes, uncomplicated: Secondary | ICD-10-CM | POA: Insufficient documentation

## 2017-08-02 DIAGNOSIS — R300 Dysuria: Secondary | ICD-10-CM | POA: Diagnosis not present

## 2017-08-02 DIAGNOSIS — S8002XA Contusion of left knee, initial encounter: Secondary | ICD-10-CM | POA: Diagnosis not present

## 2017-08-02 DIAGNOSIS — Z9889 Other specified postprocedural states: Secondary | ICD-10-CM | POA: Insufficient documentation

## 2017-08-02 DIAGNOSIS — R079 Chest pain, unspecified: Secondary | ICD-10-CM | POA: Insufficient documentation

## 2017-08-02 DIAGNOSIS — Z87440 Personal history of urinary (tract) infections: Secondary | ICD-10-CM | POA: Insufficient documentation

## 2017-08-02 DIAGNOSIS — Z79899 Other long term (current) drug therapy: Secondary | ICD-10-CM | POA: Insufficient documentation

## 2017-08-02 DIAGNOSIS — X58XXXA Exposure to other specified factors, initial encounter: Secondary | ICD-10-CM | POA: Insufficient documentation

## 2017-08-02 DIAGNOSIS — R3915 Urgency of urination: Secondary | ICD-10-CM | POA: Insufficient documentation

## 2017-08-02 LAB — URINALYSIS, COMPLETE (UACMP) WITH MICROSCOPIC
BACTERIA UA: NONE SEEN
BILIRUBIN URINE: NEGATIVE
Glucose, UA: NEGATIVE mg/dL
Hgb urine dipstick: NEGATIVE
Ketones, ur: NEGATIVE mg/dL
Nitrite: NEGATIVE
PH: 7 (ref 5.0–8.0)
Protein, ur: NEGATIVE mg/dL
RBC / HPF: NONE SEEN RBC/hpf (ref 0–5)
SPECIFIC GRAVITY, URINE: 1.015 (ref 1.005–1.030)

## 2017-08-02 LAB — RAPID STREP SCREEN (MED CTR MEBANE ONLY): STREPTOCOCCUS, GROUP A SCREEN (DIRECT): NEGATIVE

## 2017-08-02 LAB — PREGNANCY, URINE: Preg Test, Ur: NEGATIVE

## 2017-08-02 MED ORDER — CYCLOBENZAPRINE HCL 10 MG PO TABS: 10.0000 mg | ORAL_TABLET | Freq: Every day | ORAL | 0 refills | Status: DC

## 2017-08-02 NOTE — Discharge Instructions (Signed)
Rest, ice, ibuprofen/tylenol °
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
## 2017-08-04 LAB — URINE CULTURE: Culture: >=100000 — AB

## 2017-08-05 ENCOUNTER — Emergency Department
Admission: EM | Admit: 2017-08-05 | Discharge: 2017-08-05 | Disposition: A | Discharge status: Home / Self Care | Payer: Medicaid Other | Attending: Emergency Medicine | Admitting: Emergency Medicine

## 2017-08-05 ENCOUNTER — Telehealth (HOSPITAL_COMMUNITY): Payer: Self-pay

## 2017-08-05 ENCOUNTER — Ambulatory Visit
Admission: EM | Admit: 2017-08-05 | Discharge: 2017-08-05 | Disposition: A | Discharge status: Home / Self Care | Payer: Medicaid Other | Attending: Family Medicine | Admitting: Family Medicine

## 2017-08-05 ENCOUNTER — Other Ambulatory Visit: Payer: Self-pay

## 2017-08-05 DIAGNOSIS — R339 Retention of urine, unspecified: Secondary | ICD-10-CM | POA: Diagnosis not present

## 2017-08-05 DIAGNOSIS — R300 Dysuria: Secondary | ICD-10-CM | POA: Diagnosis not present

## 2017-08-05 DIAGNOSIS — B182 Chronic viral hepatitis C: Secondary | ICD-10-CM | POA: Diagnosis not present

## 2017-08-05 DIAGNOSIS — Z87440 Personal history of urinary (tract) infections: Secondary | ICD-10-CM | POA: Insufficient documentation

## 2017-08-05 DIAGNOSIS — G43909 Migraine, unspecified, not intractable, without status migrainosus: Secondary | ICD-10-CM | POA: Diagnosis not present

## 2017-08-05 DIAGNOSIS — M549 Dorsalgia, unspecified: Secondary | ICD-10-CM | POA: Insufficient documentation

## 2017-08-05 DIAGNOSIS — Z5321 Procedure and treatment not carried out due to patient leaving prior to being seen by health care provider: Secondary | ICD-10-CM | POA: Insufficient documentation

## 2017-08-05 DIAGNOSIS — Z79899 Other long term (current) drug therapy: Secondary | ICD-10-CM | POA: Diagnosis not present

## 2017-08-05 DIAGNOSIS — F17210 Nicotine dependence, cigarettes, uncomplicated: Secondary | ICD-10-CM | POA: Diagnosis not present

## 2017-08-05 DIAGNOSIS — R39198 Other difficulties with micturition: Secondary | ICD-10-CM

## 2017-08-05 LAB — CULTURE, GROUP A STREP (THRC)

## 2017-08-05 LAB — URINALYSIS, COMPLETE (UACMP) WITH MICROSCOPIC
BILIRUBIN URINE: NEGATIVE
Bacteria, UA: NONE SEEN
Glucose, UA: NEGATIVE mg/dL
KETONES UR: NEGATIVE mg/dL
Nitrite: NEGATIVE
PH: 6 (ref 5.0–8.0)
Protein, ur: NEGATIVE mg/dL
SQUAMOUS EPITHELIAL / LPF: NONE SEEN
Specific Gravity, Urine: 1.01 (ref 1.005–1.030)

## 2017-08-05 MED ORDER — PHENAZOPYRIDINE HCL 200 MG PO TABS: 200.0000 mg | ORAL_TABLET | Freq: Three times a day (TID) | ORAL | 0 refills | Status: DC

## 2017-08-05 NOTE — ED Provider Notes (Signed)
[facility]   CSN: [id number] Arrival date & time: 08/05/17  1918  History   Chief Complaint Chief Complaint  Patient presents with  . Dysuria   HPI  27 year old female presents with dysuria.  Patient reports that her symptoms began on Thursday.  She reports burning with urination, urgency, and frequency.  She states that when she goes to urinate she produces very little urine.  She had similar complaints when she was seen a few days ago and had a culture with normal vaginal flora.  No fevers or chills.  She does report back pain.  No flank pain.  No nausea vomiting.  No other associated symptoms.  No other complaints or concerns at this time.  Past Medical History:  Diagnosis Date  . Chronic hepatitis C (HCC)   . Gestational hypertension   . Polysubstance abuse (HCC)    MJ, cocaine, heroin  . Stillbirth   . Tobacco use     Patient Active Problem List   Diagnosis Date Noted  . Postpartum care following vaginal delivery 07/05/2017  . Preterm labor 07/04/2017  . Recurrent UTI 06/24/2017  . Elevated blood pressure affecting pregnancy in third trimester, antepartum 06/14/2017  . Gestational hypertension 06/14/2017  . Polysubstance abuse (HCC) 06/14/2017  . Chronic hepatitis C affecting antepartum care of mother [facility]) 06/14/2017  . Tobacco use affecting pregnancy in first trimester, antepartum 06/14/2017  . Migraines 12/30/2012  . Fibrocystic breast 04/07/2012    Past Surgical History:  Procedure Laterality Date  . EXTERNAL EAR SURGERY    . WISDOM TOOTH EXTRACTION      OB History    Gravida  1   Para  1   Term      Preterm  1   AB      Living  0     SAB      TAB      Ectopic      Multiple  0   Live Births               Home Medications    Prior to Admission medications   Medication Sig Start Date End Date Taking? Authorizing Provider  cyclobenzaprine (FLEXERIL) 10 MG tablet Take 1 tablet (10 mg total) by mouth at bedtime. 08/02/17    Payton Mccallum, MD  hydrOXYzine (ATARAX/VISTARIL) 25 MG tablet Take 1 tablet (25 mg total) by mouth every 6 (six) hours. 06/14/17   Farrel Conners, CNM  ibuprofen (ADVIL,MOTRIN) 600 MG tablet Take 1 tablet (600 mg total) by mouth every 6 (six) hours as needed. 07/05/17   Tresea Mall, CNM  phenazopyridine (PYRIDIUM) 200 MG tablet Take 1 tablet (200 mg total) by mouth 3 (three) times daily. 08/05/17   Tommie Sams, DO  Prenatal Vit-Fe Fumarate-FA (PRENATAL MULTIVITAMIN) TABS tablet Take 1 tablet by mouth daily at 12 noon. 06/14/17   Farrel Conners, CNM  sertraline (ZOLOFT) 50 MG tablet Take 1 tablet (50 mg total) by mouth daily. 07/11/17 07/11/18  Arnaldo Natal, MD    Family History Family History  Problem Relation Age of Onset  . Healthy Father   . Asthma Maternal Grandmother   . COPD Paternal Grandmother     Social History Social History   Tobacco Use  . Smoking status: Current Every Day Smoker    Packs/day: 1.00    Types: Cigarettes  . Smokeless tobacco: Never Used  Substance Use Topics  . Alcohol use: Yes    Frequency: Never  . Drug use: Yes  Types: Marijuana, Cocaine, Heroin    Comment: last use one week ago     Allergies   Latex   Review of Systems Review of Systems  Constitutional: Negative.   Genitourinary: Positive for difficulty urinating, dysuria, frequency and urgency.  Musculoskeletal: Positive for back pain.   Physical Exam Triage Vital Signs ED Triage Vitals  Enc Vitals Group     BP 08/05/17 1930 132/88     Pulse Rate 08/05/17 1927 (!) 108     Resp 08/05/17 1927 18     Temp 08/05/17 1927 98.3 F (36.8 C)     Temp Source 08/05/17 1927 Oral     SpO2 08/05/17 1927 100 %     Weight 08/05/17 1929 140 lb (63.5 kg)     Height 08/05/17 1929 5\' 4"  (1.626 m)     Head Circumference --      Peak Flow --      Pain Score 08/05/17 1929 0     Pain Loc --      Pain Edu? --      Excl. in GC? --    Updated Vital Signs BP 132/88 (BP Location: Left  Arm)   Pulse (!) 108   Temp 98.3 F (36.8 C) (Oral)   Resp 18   Ht 5\' 4"  (1.626 m)   Wt 140 lb (63.5 kg)   LMP 10/19/2016 Comment: Pt had miscarriage on 07/04/17   SpO2 100%   BMI 24.03 kg/m   Physical Exam  Constitutional: She appears well-developed. No distress.  Cardiovascular: Regular rhythm.  Tachycardic.  Pulmonary/Chest: Effort normal and breath sounds normal. She has no wheezes. She has no rales.  Abdominal: Soft. She exhibits no distension. There is no tenderness.  Neurological: She is alert.  Psychiatric:  Flat affect.   UC Treatments / Results  Labs (all labs ordered are listed, but only abnormal results are displayed) Labs Reviewed  URINALYSIS, COMPLETE (UACMP) WITH MICROSCOPIC - Abnormal; Notable for the following components:      Result Value   Color, Urine STRAW (*)    Hgb urine dipstick TRACE (*)    Leukocytes, UA SMALL (*)    All other components within normal limits  URINE CULTURE   EKG None Radiology No results found.  Procedures Procedures (including critical care time)  Medications Ordered in UC Medications - No data to display   Initial Impression / Assessment and Plan / UC Course  I have reviewed the triage vital signs and the nursing notes.  Pertinent labs & imaging results that were available during my care of the patient were reviewed by me and considered in my medical decision making (see chart for details).    27 year old female presents with urinary symptoms.  Microscopy unremarkable.  Doubt UTI.  Sending culture.  Patient requesting note for her probation officer.  I gave her a note stating that she was seen here and that she reported difficulty urinating and produced very little urine.  Final Clinical Impressions(s) / UC Diagnoses   Final diagnoses:  Dysuria    ED Discharge Orders        Ordered    phenazopyridine (PYRIDIUM) 200 MG tablet  3 times daily     08/05/17 1958     Controlled Substance Prescriptions [place]  Controlled Substance Registry consulted? Not Applicable   Tommie SamsCook, Kush Farabee G, DO 08/05/17 2051
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
## 2017-08-05 NOTE — Telephone Encounter (Signed)
Culture is positive for non group A Strep germ.  This is a finding of uncertain significance; not the typical 'strep throat' germ.  Attempted to call patient regarding results. No answer at this time. Message left to please call back.
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
## 2017-08-05 NOTE — Discharge Instructions (Signed)
Your urine was normal.  We will call with the culture results.  Use the pyridium as needed.  Take care  Dr. Adriana Simasook
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
## 2017-08-05 NOTE — ED Triage Notes (Signed)
Pt with trouble starting stream of urine and burning with urination

## 2017-08-06 ENCOUNTER — Telehealth: Payer: Self-pay | Admitting: Emergency Medicine

## 2017-08-06 NOTE — Telephone Encounter (Signed)
Received a call today from Karen Brooks, probation officer, for patient. Mr. Karen Brooks called today requesting information on patient's recent visit. I advised I could not release medical information without permission from the patient. Patient got on the phone and gave her verbal permission to speak with Mr. Karen Brooks.  Mr. Karen Brooks states that patient is suppose to have random drug screens as part of her probation and patient is stating that she can not urinate. That she only dribbles. I advised Mr. Karen Brooks of patient urine results and urine culture pending. I advised Mr. Karen Brooks that there is no way for us to know if patient is having urinary retention as we do not cath patients or do bladder scans.   Mr. Karen Brooks asked if patient was medically stable to go to jail. I advised that there was no way for us say since we were only checking for a UTI (infection), not urinary retention. I recommended he take patient to the ED for evaluation of urinary retention. Mr. Karen Brooks voiced understanding and thanked me for my help.

## 2017-08-07 LAB — URINE CULTURE

## 2017-08-15 ENCOUNTER — Encounter: Payer: Self-pay | Admitting: Emergency Medicine

## 2017-08-15 ENCOUNTER — Emergency Department
Admission: EM | Admit: 2017-08-15 | Discharge: 2017-08-15 | Disposition: A | Discharge status: Home / Self Care | Payer: Medicaid Other | Attending: Emergency Medicine | Admitting: Emergency Medicine

## 2017-08-15 ENCOUNTER — Emergency Department: Payer: Medicaid Other

## 2017-08-15 DIAGNOSIS — Z79899 Other long term (current) drug therapy: Secondary | ICD-10-CM | POA: Diagnosis not present

## 2017-08-15 DIAGNOSIS — N939 Abnormal uterine and vaginal bleeding, unspecified: Secondary | ICD-10-CM

## 2017-08-15 DIAGNOSIS — F17210 Nicotine dependence, cigarettes, uncomplicated: Secondary | ICD-10-CM | POA: Insufficient documentation

## 2017-08-15 DIAGNOSIS — Z3202 Encounter for pregnancy test, result negative: Secondary | ICD-10-CM | POA: Insufficient documentation

## 2017-08-15 DIAGNOSIS — R102 Pelvic and perineal pain: Secondary | ICD-10-CM | POA: Insufficient documentation

## 2017-08-15 DIAGNOSIS — Z91040 Latex allergy status: Secondary | ICD-10-CM | POA: Diagnosis not present

## 2017-08-15 DIAGNOSIS — N938 Other specified abnormal uterine and vaginal bleeding: Secondary | ICD-10-CM | POA: Diagnosis not present

## 2017-08-15 DIAGNOSIS — R109 Unspecified abdominal pain: Secondary | ICD-10-CM | POA: Diagnosis present

## 2017-08-15 LAB — CBC
HEMATOCRIT: 42.2 % (ref 35.0–47.0)
HEMOGLOBIN: 14.2 g/dL (ref 12.0–16.0)
MCH: 30.6 pg (ref 26.0–34.0)
MCHC: 33.5 g/dL (ref 32.0–36.0)
MCV: 91.3 fL (ref 80.0–100.0)
Platelets: 321 10*3/uL (ref 150–440)
RBC: 4.63 MIL/uL (ref 3.80–5.20)
RDW: 14.1 % (ref 11.5–14.5)
WBC: 12.5 10*3/uL — AB (ref 3.6–11.0)

## 2017-08-15 LAB — URINALYSIS, COMPLETE (UACMP) WITH MICROSCOPIC
BILIRUBIN URINE: NEGATIVE
Glucose, UA: NEGATIVE mg/dL
Ketones, ur: NEGATIVE mg/dL
LEUKOCYTES UA: NEGATIVE
NITRITE: NEGATIVE
Protein, ur: NEGATIVE mg/dL
SPECIFIC GRAVITY, URINE: 1.02 (ref 1.005–1.030)
pH: 5 (ref 5.0–8.0)

## 2017-08-15 LAB — POCT PREGNANCY, URINE: Preg Test, Ur: NEGATIVE

## 2017-08-15 LAB — TYPE AND SCREEN
ABO/RH(D): B POS
ANTIBODY SCREEN: NEGATIVE

## 2017-08-15 LAB — COMPREHENSIVE METABOLIC PANEL
ALBUMIN: 3.9 g/dL (ref 3.5–5.0)
ALT: 80 U/L — ABNORMAL HIGH (ref 14–54)
ANION GAP: 8 (ref 5–15)
AST: 72 U/L — AB (ref 15–41)
Alkaline Phosphatase: 132 U/L — ABNORMAL HIGH (ref 38–126)
BILIRUBIN TOTAL: 0.7 mg/dL (ref 0.3–1.2)
BUN: 6 mg/dL (ref 6–20)
CHLORIDE: 109 mmol/L (ref 101–111)
CO2: 22 mmol/L (ref 22–32)
Calcium: 8.6 mg/dL — ABNORMAL LOW (ref 8.9–10.3)
Creatinine, Ser: 0.67 mg/dL (ref 0.44–1.00)
GFR calc Af Amer: >60 mL/min (ref >60)
Glucose, Bld: 111 mg/dL — ABNORMAL HIGH (ref 65–99)
POTASSIUM: 3.3 mmol/L — AB (ref 3.5–5.1)
Sodium: 139 mmol/L (ref 135–145)
TOTAL PROTEIN: 7.2 g/dL (ref 6.5–8.1)

## 2017-08-15 LAB — HCG, QUANTITATIVE, PREGNANCY: hCG, Beta Chain, Quant, S: <1 m[IU]/mL (ref <5)

## 2017-08-15 NOTE — ED Notes (Signed)
Lab notified to add on hCG. 

## 2017-08-15 NOTE — ED Notes (Signed)
Pt verbalizes d/c understanding and follow up. Pt in NAD, VSS, ambulatory. Pt signed papercopy form of discharge

## 2017-08-15 NOTE — Discharge Instructions (Addendum)
If you have bleeding more than a pad an hour, lightheadedness, multiple black stools, bleeding from your bottom, significant pain, fever or you feel worse in any way return to the emergency department.  Follow-up with your OB/GYN without fail.  Without a pelvic exam, as we discussed, we are limited in our ability to assess you so we ask you to please be very vigilant and watchful at home for worsening symptoms.

## 2017-08-15 NOTE — ED Notes (Signed)
Patient transported to Ultrasound 
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
## 2017-08-15 NOTE — ED Triage Notes (Addendum)
Documented in error.

## 2017-08-15 NOTE — ED Provider Notes (Addendum)
[facility] Emergency Department Provider Note  ____________________________________________   I have reviewed the triage vital signs and the nursing notes. Where available I have reviewed prior notes and, if possible and indicated, outside hospital notes.    HISTORY  Chief Complaint Abdominal Pain    HPI Karen Brooks is a 27 y.o. female who has had unfortunately a near term fetal demise approximately 6 weeks ago has not had menstrual period since then now is having cramping and vaginal bleeding.  Denies fever chills.  No vomiting, states that over the last couple weeks she has noted 1 or 2 stools that seem darker than normal but not black.  She denies any lightheadedness.  The bleeding began a few days ago she states.  She is not exactly sure when.  Began gradually.  About equal to her menstrual period.  However she has not had one since her pregnancy began she wants to make sure everything is okay.  Cramps are also similar to menstrual period cramps suprapubic nonradiating.  Patient states she does not want a rectal exam and she does not want a pelvic exam.  She does consent to pelvic exam.  Pain is crampy nonradiating nothing makes better nothing makes her nervous no other alleviating or aggravating factors no other associated symptoms   Past Medical History:  Diagnosis Date  . Chronic hepatitis C (HCC)   . Gestational hypertension   . Polysubstance abuse (HCC)    MJ, cocaine, heroin  . Stillbirth   . Tobacco use     Patient Active Problem List   Diagnosis Date Noted  . Postpartum care following vaginal delivery 07/05/2017  . Preterm labor 07/04/2017  . Recurrent UTI 06/24/2017  . Elevated blood pressure affecting pregnancy in third trimester, antepartum 06/14/2017  . Gestational hypertension 06/14/2017  . Polysubstance abuse (HCC) 06/14/2017  . Chronic hepatitis C affecting antepartum care of mother [facility](HCC) 06/14/2017  . Tobacco use affecting  pregnancy in first trimester, antepartum 06/14/2017  . Migraines 12/30/2012  . Fibrocystic breast 04/07/2012    Past Surgical History:  Procedure Laterality Date  . EXTERNAL EAR SURGERY    . WISDOM TOOTH EXTRACTION      Prior to Admission medications   Medication Sig Start Date End Date Taking? Authorizing Provider  cyclobenzaprine (FLEXERIL) 10 MG tablet Take 1 tablet (10 mg total) by mouth at bedtime. 08/02/17   Payton Mccallumonty, [place], MD  hydrOXYzine (ATARAX/VISTARIL) 25 MG tablet Take 1 tablet (25 mg total) by mouth every 6 (six) hours. 06/14/17   Farrel ConnersGutierrez, Colleen, CNM  ibuprofen (ADVIL,MOTRIN) 600 MG tablet Take 1 tablet (600 mg total) by mouth every 6 (six) hours as needed. 07/05/17   Tresea MallGledhill, Jane, CNM  phenazopyridine (PYRIDIUM) 200 MG tablet Take 1 tablet (200 mg total) by mouth 3 (three) times daily. 08/05/17   Tommie Samsook, Jayce G, DO  Prenatal Vit-Fe Fumarate-FA (PRENATAL MULTIVITAMIN) TABS tablet Take 1 tablet by mouth daily at 12 noon. 06/14/17   Farrel ConnersGutierrez, Colleen, CNM  sertraline (ZOLOFT) 50 MG tablet Take 1 tablet (50 mg total) by mouth daily. 07/11/17 07/11/18  Arnaldo NatalMalinda, Paul F, MD    Allergies Latex  Family History  Problem Relation Age of Onset  . Healthy Father   . Asthma Maternal Grandmother   . COPD Paternal Grandmother     Social History Social History   Tobacco Use  . Smoking status: Current Every Day Smoker    Packs/day: 1.00    Types: Cigarettes  . Smokeless tobacco: Never Used  Substance Use Topics  . Alcohol use: Yes    Frequency: Never  . Drug use: Yes    Types: Marijuana, Cocaine, Heroin    Comment: last use one week ago    Review of Systems Constitutional: No fever/chills Eyes: No visual changes. ENT: No sore throat. No stiff neck no neck pain Cardiovascular: Denies chest pain. Respiratory: Denies shortness of breath. Gastrointestinal:   no vomiting.  No diarrhea.  No constipation. Genitourinary: Negative for dysuria. Musculoskeletal: Negative lower  extremity swelling Skin: Negative for rash. Neurological: Negative for severe headaches, focal weakness or numbness.   ____________________________________________   PHYSICAL EXAM:  VITAL SIGNS: ED Triage Vitals  Enc Vitals Group     BP 08/15/17 1244 137/80     Pulse Rate 08/15/17 1244 73     Resp 08/15/17 1244 14     Temp 08/15/17 1244 97.9 F (36.6 C)     Temp Source 08/15/17 1244 Oral     SpO2 08/15/17 1244 98 %     Weight 08/15/17 1239 140 lb (63.5 kg)     Height 08/15/17 1239 5\' 4"  (1.626 m)     Head Circumference --      Peak Flow --      Pain Score 08/15/17 1239 9     Pain Loc --      Pain Edu? --      Excl. in GC? --     Constitutional: Alert and oriented. Well appearing and in no acute distress. Eyes: Conjunctivae are normal Head: Atraumatic HEENT: No congestion/rhinnorhea. Mucous membranes are moist.  Oropharynx non-erythematous Neck:   Nontender with no meningismus, no masses, no stridor Cardiovascular: Normal rate, regular rhythm. Grossly normal heart sounds.  Good peripheral circulation. Respiratory: Normal respiratory effort.  No retractions. Lungs CTAB. Abdominal: Soft and very slight suprapubic tenderness. No distention. No guarding no rebound Back:  There is no focal tenderness or step off.  there is no midline tenderness there are no lesions noted. there is no CVA tenderness GU: Patient refuses Musculoskeletal: No lower extremity tenderness, no upper extremity tenderness. No joint effusions, no DVT signs strong distal pulses no edema Neurologic:  Normal speech and language. No gross focal neurologic deficits are appreciated.  Skin:  Skin is warm, dry and intact. No rash noted. Psychiatric: Mood and affect are normal. Speech and behavior are normal.  ____________________________________________   LABS (all labs ordered are listed, but only abnormal results are displayed)  Labs Reviewed  COMPREHENSIVE METABOLIC PANEL - Abnormal; Notable for the  following components:      Result Value   Potassium 3.3 (*)    Glucose, Bld 111 (*)    Calcium 8.6 (*)    AST 72 (*)    ALT 80 (*)    Alkaline Phosphatase 132 (*)    All other components within normal limits  CBC - Abnormal; Notable for the following components:   WBC 12.5 (*)    All other components within normal limits  URINALYSIS, COMPLETE (UACMP) WITH MICROSCOPIC - Abnormal; Notable for the following components:   Color, Urine AMBER (*)    APPearance HAZY (*)    Hgb urine dipstick SMALL (*)    Bacteria, UA RARE (*)    All other components within normal limits  CHLAMYDIA/NGC RT PCR ([facility] ONLY)  WET PREP, GENITAL  HCG, QUANTITATIVE, PREGNANCY  POC URINE PREG, ED  POCT PREGNANCY, URINE  POC OCCULT BLOOD, ED  TYPE AND SCREEN    Pertinent labs  results that  were available during my care of the patient were reviewed by me and considered in my medical decision making (see chart for details). ____________________________________________  EKG  I personally interpreted any EKGs ordered by me or triage  ____________________________________________  RADIOLOGY  Pertinent labs & imaging results that were available during my care of the patient were reviewed by me and considered in my medical decision making (see chart for details). If possible, patient and/or family made aware of any abnormal findings.  No results found. ____________________________________________    PROCEDURES  Procedure(s) performed: None  Procedures  Critical Care performed: None  ____________________________________________   INITIAL IMPRESSION / ASSESSMENT AND PLAN / ED COURSE  Pertinent labs & imaging results that were available during my care of the patient were reviewed by me and considered in my medical decision making (see chart for details).  Patient here with menstrual cramp-like discomfort on her first menstrual period since her stillborn.  She is concerned that perhaps  something else is going on she wants to be sure there is no other comp occasions or concerns.  She does refuse pelvic exam and rectal exam she understands without this to interventions, there is a limited my ability to diagnose her, she is very comfortable with this and states that she does not wish that done.  She will follow-up with her OB she states as needed.  She does consent to an ultrasound which we have obtained.  Her blood work is reassuring vital signs are reassuring abdominal exam is reassuring, no evidence of an acute GI bleed no evidence of significant vaginal bleeding, no evidence of any particular pathology of note aside from her initial menstrual period after her pregnancy but obviously without pelvic exam there is limited with likely diagnosed.  The blood work and vital signs are all quite reassuring.  If ultrasound does not show retained products of conception, we will discharge patient and she is already requesting discharge multiple times but I have asked her to stay until the results are back  ----------------------------------------- 4:25 PM on 08/15/2017 -----------------------------------------  Discussed with Dr. Gaynelle Arabian of [facility], she evaluated the ultrasound results, does not see anything concerning vital signs blood work reassuring abdomen remains benign.  Patient refused vaginal exam and also transvaginal pelvic exam.  She understands limitations displaces upon her work-up.  She will follow-up closely with OB/GYN return precautions and follow-up extensively explained    ____________________________________________   FINAL CLINICAL IMPRESSION(S) / ED DIAGNOSES  Final diagnoses:  Pelvic pain      This chart was dictated using voice recognition software.  Despite best efforts to proofread,  errors can occur which can change meaning.      Jeanmarie Plant, MD 08/15/17 1610    Jeanmarie Plant, MD 08/15/17 1626

## 2017-08-15 NOTE — ED Notes (Signed)
Pelvic cart set up at bedside  

## 2017-08-21 ENCOUNTER — Ambulatory Visit (INDEPENDENT_AMBULATORY_CARE_PROVIDER_SITE_OTHER): Payer: Medicaid Other | Admitting: Obstetrics and Gynecology

## 2017-08-21 ENCOUNTER — Encounter: Payer: Self-pay | Admitting: Obstetrics and Gynecology

## 2017-08-21 DIAGNOSIS — O99345 Other mental disorders complicating the puerperium: Secondary | ICD-10-CM | POA: Diagnosis not present

## 2017-08-21 DIAGNOSIS — F53 Postpartum depression: Secondary | ICD-10-CM

## 2017-08-21 DIAGNOSIS — Z30011 Encounter for initial prescription of contraceptive pills: Secondary | ICD-10-CM | POA: Diagnosis not present

## 2017-08-21 MED ORDER — NORETHINDRONE 0.35 MG PO TABS: 1.0000 | ORAL_TABLET | Freq: Every day | ORAL | 11 refills | Status: DC

## 2017-08-21 MED ORDER — BUPROPION HCL ER (XL) 150 MG PO TB24: 150.0000 mg | ORAL_TABLET | Freq: Every day | ORAL | 0 refills | Status: DC

## 2017-08-21 NOTE — Progress Notes (Signed)
Obstetrics & Gynecology Office Visit   Chief Complaint  Patient presents with  . Follow-up    History of Present Illness: 27 y.o. G60P0100 female who is about 6-7 weeks postpartum from a stillborn at [redacted] weeks gestation. The patient's pregnancy was complicated by gestational hypertension, non-compliance with medical care, and with cocaine use. She was negative for cocaine on a UDS on admission for her stillborn delivery. She has been seen in the ER several times since her delivery for various reasons. About 1 week ago she presented with vaginal bleeding and was Dr. Jerene Pitch cleared her for retained products of conception based on her ultrasound.  Of note, she refused a transvaginal ultrasound and pelvic exam in the ER.  She had been started on Zoloft about 1 week after the stillbirth. She states that the medication affected her very negatively. She has been feeling very anxious and depressed. She has not been taking the medication for a while. Her EPDS is 16 today. She denies SI/HI. She is very anxious and stressed.  Her sister just had a baby last week and now this is making things worse.   Her bleeding stopped initially. But, was bleeding a lot last week. She was evaluated in the ER and was cleared. She states the bleeding stopped.  She denies breast issues. She believes that she will likely get on the pill.     Denies breast issues, current vaginal bleeding issues. Notably, her LFTs were elevated at the ER 6 days ago.    Past Medical History:  Diagnosis Date  . Chronic hepatitis C (HCC)   . Gestational hypertension   . Polysubstance abuse (HCC)    MJ, cocaine, heroin  . Stillbirth   . Tobacco use     Past Surgical History:  Procedure Laterality Date  . EXTERNAL EAR SURGERY    . WISDOM TOOTH EXTRACTION      Gynecologic History: Patient's last menstrual period was 10/19/2016.  Obstetric History: G1P0100  Family History  Problem Relation Age of Onset  . Healthy Father   . Asthma  Maternal Grandmother   . COPD Paternal Grandmother     Social History   Socioeconomic History  . Marital status: Single    Spouse name: Not on file  . Number of children: Not on file  . Years of education: Not on file  . Highest education level: Not on file  Occupational History  . Not on file  Social Needs  . Financial resource strain: Not on file  . Food insecurity:    Worry: Not on file    Inability: Not on file  . Transportation needs:    Medical: Not on file    Non-medical: Not on file  Tobacco Use  . Smoking status: Current Every Day Smoker    Packs/day: 1.00    Types: Cigarettes  . Smokeless tobacco: Never Used  Substance and Sexual Activity  . Alcohol use: Yes    Frequency: Never  . Drug use: Yes    Types: Marijuana, Cocaine, Heroin    Comment: last use one week ago  . Sexual activity: Yes    Partners: Male  Lifestyle  . Physical activity:    Days per week: Not on file    Minutes per session: Not on file  . Stress: Not on file  Relationships  . Social connections:    Talks on phone: Not on file    Gets together: Not on file    Attends religious service: Not on file  Active member of club or organization: Not on file    Attends meetings of clubs or organizations: Not on file    Relationship status: Not on file  . Intimate partner violence:    Fear of current or ex partner: Not on file    Emotionally abused: Not on file    Physically abused: Not on file    Forced sexual activity: Not on file  Other Topics Concern  . Not on file  Social History Narrative  . Not on file    Allergies  Allergen Reactions  . Latex Rash    Current Medications: none  Review of Systems  Constitutional: Negative.   HENT: Positive for congestion, sinus pain and sore throat.   Eyes: Negative.   Respiratory: Positive for cough (x a couple months). Negative for hemoptysis, sputum production, shortness of breath and wheezing.   Cardiovascular: Negative.     Gastrointestinal: Positive for nausea and vomiting. Negative for abdominal pain, constipation and diarrhea.  Genitourinary: Positive for dysuria. Negative for flank pain, frequency, hematuria and urgency.  Musculoskeletal: Negative.   Skin: Negative.   Neurological: Negative.   Endo/Heme/Allergies: Negative.   Psychiatric/Behavioral: Positive for depression and substance abuse. The patient is nervous/anxious.      Physical Exam BP 122/74   Ht  (1.626 m)   Wt 159 lb (72.1 kg)   LMP 10/19/2016   BMI 27.29 kg/m  Patient's last menstrual period was 10/19/2016. Physical Exam  Constitutional: She is oriented to person, place, and time. She appears well-developed and well-nourished. No distress.  Eyes: EOM are normal. No scleral icterus.  Neck: Normal range of motion. Neck supple.  Cardiovascular: Normal rate and regular rhythm.  Pulmonary/Chest: Effort normal and breath sounds normal. No respiratory distress. She has no wheezes. She has no rales.  Abdominal: Soft. Bowel sounds are normal. She exhibits no distension and no mass. There is no tenderness. There is no rebound and no guarding.  Musculoskeletal: Normal range of motion. She exhibits no edema.  Neurological: She is alert and oriented to person, place, and time. No cranial nerve deficit.  Skin: Skin is warm and dry. No erythema.  Psychiatric: She has a normal mood and affect. Her behavior is normal. Judgment normal.   Assessment: 27 y.o. G20P0100 female here for  1. Postpartum care and examination   2. Postpartum depression   3. Encounter for initial prescription of contraceptive pills      Plan: Problem List Items Addressed This Visit    None    Visit Diagnoses    Postpartum care and examination    -  Primary   Postpartum depression       Relevant Medications   buPROPion (WELLBUTRIN XL) 150 MG 24 hr tablet   Encounter for initial prescription of contraceptive pills       Relevant Medications   norethindrone  (MICRONOR,CAMILA,ERRIN) 0.35 MG tablet     Contraception: i'm not comfortable giving her estrogen with her elevated LFTs.  Discussed getting treatment for hepatitis C.   Will start her with progesterone-only OCPs.  She is not interested in LARCs, though these were emphasized to her.  Depression: start wellbutrin (she did not tolerate an SSRI). Discussed [facility] as resources and recommend both medical and counseling treatment for her current grief and depression.  She is able to contract for safety today. She was instructed to go to the ER should her symptoms worsen.  She voiced understanding and agreement.  20 minutes spent in  face to face discussion with > 50% spent in counseling,management, and coordination of care of her contraception and depression.   Thomasene Mohair, MD 08/21/2017 2:07 PM

## 2017-08-30 ENCOUNTER — Other Ambulatory Visit: Payer: Self-pay

## 2017-08-30 ENCOUNTER — Ambulatory Visit
Admission: EM | Admit: 2017-08-30 | Discharge: 2017-08-30 | Disposition: A | Discharge status: Home / Self Care | Payer: Medicaid Other | Attending: Family Medicine | Admitting: Family Medicine

## 2017-08-30 ENCOUNTER — Ambulatory Visit: Payer: Medicaid Other

## 2017-08-30 DIAGNOSIS — M79642 Pain in left hand: Secondary | ICD-10-CM | POA: Insufficient documentation

## 2017-08-30 DIAGNOSIS — W19XXXA Unspecified fall, initial encounter: Secondary | ICD-10-CM | POA: Diagnosis not present

## 2017-08-30 DIAGNOSIS — F17210 Nicotine dependence, cigarettes, uncomplicated: Secondary | ICD-10-CM | POA: Diagnosis not present

## 2017-08-30 DIAGNOSIS — S6392XA Sprain of unspecified part of left wrist and hand, initial encounter: Secondary | ICD-10-CM

## 2017-08-30 DIAGNOSIS — Z79899 Other long term (current) drug therapy: Secondary | ICD-10-CM | POA: Diagnosis not present

## 2017-08-30 DIAGNOSIS — I10 Essential (primary) hypertension: Secondary | ICD-10-CM | POA: Diagnosis not present

## 2017-08-30 DIAGNOSIS — B182 Chronic viral hepatitis C: Secondary | ICD-10-CM | POA: Insufficient documentation

## 2017-08-30 MED ORDER — ACETAMINOPHEN 500 MG PO TABS
1000.0000 mg | ORAL_TABLET | Freq: Once | ORAL | Status: AC
  Administered 2017-08-30: 1000 mg via ORAL

## 2017-08-30 MED ORDER — ACETAMINOPHEN-CODEINE #3 300-30 MG PO TABS: 1.0000 | ORAL_TABLET | Freq: Three times a day (TID) | ORAL | 0 refills | Status: DC | PRN

## 2017-08-30 NOTE — ED Provider Notes (Signed)
[facility]    CSN: [id number] Arrival date & time: 08/30/17  1041     History   Chief Complaint Chief Complaint  Patient presents with  . Hand Pain    HPI Karen Brooks is a 27 y.o. female.   27 yo female with a c/o left hand pain and some swelling since this morning. States she was drinking alcohol last night, got drunk and states she doesn't recall how she might have injured her hand.   The history is provided by the patient.    Past Medical History:  Diagnosis Date  . Chronic hepatitis C (HCC)   . Gestational hypertension   . Polysubstance abuse (HCC)    MJ, cocaine, heroin  . Stillbirth   . Tobacco use     Patient Active Problem List   Diagnosis Date Noted  . Postpartum care following vaginal delivery 07/05/2017  . Preterm labor 07/04/2017  . Recurrent UTI 06/24/2017  . Elevated blood pressure affecting pregnancy in third trimester, antepartum 06/14/2017  . Gestational hypertension 06/14/2017  . Polysubstance abuse (HCC) 06/14/2017  . Chronic hepatitis C affecting antepartum care of mother [facility]) 06/14/2017  . Tobacco use affecting pregnancy in first trimester, antepartum 06/14/2017  . Migraines 12/30/2012  . Fibrocystic breast 04/07/2012    Past Surgical History:  Procedure Laterality Date  . EXTERNAL EAR SURGERY    . WISDOM TOOTH EXTRACTION      OB History    Gravida  1   Para  1   Term      Preterm  1   AB      Living  0     SAB      TAB      Ectopic      Multiple  0   Live Births               Home Medications    Prior to Admission medications   Medication Sig Start Date End Date Taking? Authorizing Provider  acetaminophen-codeine (TYLENOL #3) 300-30 MG tablet Take 1-2 tablets by mouth every 8 (eight) hours as needed for moderate pain. 08/30/17   Payton Mccallum, MD  buPROPion (WELLBUTRIN XL) 150 MG 24 hr tablet Take 1 tablet (150 mg total) by mouth daily. 08/21/17   Conard Novak, MD  cyclobenzaprine  (FLEXERIL) 10 MG tablet Take 1 tablet (10 mg total) by mouth at bedtime. Patient not taking: Reported on 08/21/2017 08/02/17   Payton Mccallum, MD  hydrOXYzine (ATARAX/VISTARIL) 25 MG tablet Take 1 tablet (25 mg total) by mouth every 6 (six) hours. Patient not taking: Reported on 08/21/2017 06/14/17   Farrel Conners, CNM  ibuprofen (ADVIL,MOTRIN) 600 MG tablet Take 1 tablet (600 mg total) by mouth every 6 (six) hours as needed. Patient not taking: Reported on 08/21/2017 07/05/17   Tresea Mall, CNM  norethindrone (MICRONOR,CAMILA,ERRIN) 0.35 MG tablet Take 1 tablet (0.35 mg total) by mouth daily. 08/21/17   Conard Novak, MD  phenazopyridine (PYRIDIUM) 200 MG tablet Take 1 tablet (200 mg total) by mouth 3 (three) times daily. Patient not taking: Reported on 08/21/2017 08/05/17   Tommie Sams, DO  Prenatal Vit-Fe Fumarate-FA (PRENATAL MULTIVITAMIN) TABS tablet Take 1 tablet by mouth daily at 12 noon. Patient not taking: Reported on 08/21/2017 06/14/17   Farrel Conners, CNM  sertraline (ZOLOFT) 50 MG tablet Take 1 tablet (50 mg total) by mouth daily. Patient not taking: Reported on 08/21/2017 07/11/17 07/11/18  Arnaldo Natal, MD  Family History Family History  Problem Relation Age of Onset  . Healthy Father   . Asthma Maternal Grandmother   . COPD Paternal Grandmother     Social History Social History   Tobacco Use  . Smoking status: Current Every Day Smoker    Packs/day: 1.00    Types: Cigarettes  . Smokeless tobacco: Never Used  Substance Use Topics  . Alcohol use: Yes    Frequency: Never    Comment: social  . Drug use: Yes    Types: Cocaine, Heroin    Comment: last use last week     Allergies   Latex   Review of Systems Review of Systems   Physical Exam Triage Vital Signs ED Triage Vitals  Enc Vitals Group     BP 08/30/17 1048 109/75     Pulse Rate 08/30/17 1048 92     Resp 08/30/17 1048 16     Temp 08/30/17 1048 97.7 F (36.5 C)     Temp Source 08/30/17 1048  Oral     SpO2 08/30/17 1048 100 %     Weight 08/30/17 1049 159 lb (72.1 kg)     Height 08/30/17 1049  (1.626 m)     Head Circumference --      Peak Flow --      Pain Score 08/30/17 1049 9     Pain Loc --      Pain Edu? --      Excl. in GC? --    No data found.  Updated Vital Signs BP 109/75 (BP Location: Left Arm)   Pulse 92   Temp 97.7 F (36.5 C) (Oral)   Resp 16   Ht  (1.626 m)   Wt 159 lb (72.1 kg)   LMP 08/16/2017 Comment: pt states she had still birth baby march 14th and has had irregular bleeding since but denies preg now  SpO2 100%   BMI 27.29 kg/m   Visual Acuity Right Eye Distance:   Left Eye Distance:   Bilateral Distance:    Right Eye Near:   Left Eye Near:    Bilateral Near:     Physical Exam  Constitutional: She appears well-developed and well-nourished. No distress.  Musculoskeletal:       Left hand: She exhibits tenderness, bony tenderness (diffuse) and swelling. She exhibits normal range of motion, normal two-point discrimination, normal capillary refill, no deformity and no laceration. Normal sensation noted. Normal strength noted.  Skin: She is not diaphoretic.  Nursing note and vitals reviewed.    UC Treatments / Results  Labs (all labs ordered are listed, but only abnormal results are displayed) Labs Reviewed - No data to display  EKG None  Radiology Dg Hand Complete Left  Result Date: 08/30/2017 CLINICAL DATA:  Hand pain after a fall. EXAM: LEFT HAND - COMPLETE 3+ VIEW COMPARISON:  08/02/2017 FINDINGS: There is no evidence of fracture or dislocation. There is no evidence of arthropathy or other focal bone abnormality. Soft tissues are unremarkable. IMPRESSION: Negative. Electronically Signed   By: Kennith Center M.D.   On: 08/30/2017 11:26    Procedures Procedures (including critical care time)  Medications Ordered in UC Medications  acetaminophen (TYLENOL) tablet 1,000 mg (1,000 mg Oral Given 08/30/17 1133)    Initial  Impression / Assessment and Plan / UC Course  I have reviewed the triage vital signs and the nursing notes.  Pertinent labs & imaging results that were available during my care of the patient were  reviewed by me and considered in my medical decision making (see chart for details).     Final Clinical Impressions(s) / UC Diagnoses   Final diagnoses:  Hand sprain, left, initial encounter    ED Prescriptions    Medication Sig Dispense Auth. Provider   acetaminophen-codeine (TYLENOL #3) 300-30 MG tablet Take 1-2 tablets by mouth every 8 (eight) hours as needed for moderate pain. 6 tablet Payton Mccallum, MD     1. x-ray results and diagnosis reviewed with patient 2. rx as per orders above; reviewed possible side effects, interactions, risks and benefits  3. Recommend supportive treatment with rest, ice, elevation, otc analgesics prn; cock up splint given  4. Follow-up prn if symptoms worsen or don't improve  Controlled Substance Prescriptions [place] Controlled Substance Registry consulted? Not Applicable   Payton Mccallum, MD 08/30/17 [id number]

## 2017-08-30 NOTE — ED Triage Notes (Signed)
Pt reports she was drinking a lot last night and unsure what happened, but woke up with left hand pain.

## 2017-09-16 ENCOUNTER — Inpatient Hospital Stay
Admission: EM | Admit: 2017-09-16 | Discharge: 2017-09-17 | DRG: 918 | Discharge status: Against Medical Advice | Payer: Medicaid Other | Attending: Internal Medicine | Admitting: Internal Medicine

## 2017-09-16 ENCOUNTER — Other Ambulatory Visit: Payer: Self-pay

## 2017-09-16 ENCOUNTER — Encounter: Payer: Self-pay | Admitting: Emergency Medicine

## 2017-09-16 DIAGNOSIS — O99345 Other mental disorders complicating the puerperium: Secondary | ICD-10-CM

## 2017-09-16 DIAGNOSIS — F53 Postpartum depression: Secondary | ICD-10-CM

## 2017-09-16 DIAGNOSIS — T50901A Poisoning by unspecified drugs, medicaments and biological substances, accidental (unintentional), initial encounter: Secondary | ICD-10-CM | POA: Diagnosis present

## 2017-09-16 DIAGNOSIS — B182 Chronic viral hepatitis C: Secondary | ICD-10-CM | POA: Diagnosis present

## 2017-09-16 DIAGNOSIS — Z825 Family history of asthma and other chronic lower respiratory diseases: Secondary | ICD-10-CM

## 2017-09-16 DIAGNOSIS — F419 Anxiety disorder, unspecified: Secondary | ICD-10-CM | POA: Diagnosis present

## 2017-09-16 DIAGNOSIS — O98419 Viral hepatitis complicating pregnancy, unspecified trimester: Secondary | ICD-10-CM

## 2017-09-16 DIAGNOSIS — F1414 Cocaine abuse with cocaine-induced mood disorder: Secondary | ICD-10-CM | POA: Diagnosis present

## 2017-09-16 DIAGNOSIS — Z91040 Latex allergy status: Secondary | ICD-10-CM

## 2017-09-16 DIAGNOSIS — T50902A Poisoning by unspecified drugs, medicaments and biological substances, intentional self-harm, initial encounter: Principal | ICD-10-CM

## 2017-09-16 DIAGNOSIS — K7010 Alcoholic hepatitis without ascites: Secondary | ICD-10-CM | POA: Diagnosis present

## 2017-09-16 DIAGNOSIS — F1994 Other psychoactive substance use, unspecified with psychoactive substance-induced mood disorder: Secondary | ICD-10-CM

## 2017-09-16 DIAGNOSIS — F1410 Cocaine abuse, uncomplicated: Secondary | ICD-10-CM

## 2017-09-16 DIAGNOSIS — F17210 Nicotine dependence, cigarettes, uncomplicated: Secondary | ICD-10-CM | POA: Diagnosis present

## 2017-09-16 DIAGNOSIS — F1010 Alcohol abuse, uncomplicated: Secondary | ICD-10-CM | POA: Diagnosis present

## 2017-09-16 DIAGNOSIS — R45851 Suicidal ideations: Secondary | ICD-10-CM

## 2017-09-16 LAB — URINE DRUG SCREEN, QUALITATIVE (ARMC ONLY)
Amphetamines, Ur Screen: NOT DETECTED
Barbiturates, Ur Screen: NOT DETECTED
Benzodiazepine, Ur Scrn: NOT DETECTED
COCAINE METABOLITE, UR ~~LOC~~: POSITIVE — AB
Cannabinoid 50 Ng, Ur ~~LOC~~: NOT DETECTED
MDMA (Ecstasy)Ur Screen: NOT DETECTED
METHADONE SCREEN, URINE: NOT DETECTED
OPIATE, UR SCREEN: NOT DETECTED
Phencyclidine (PCP) Ur S: NOT DETECTED
Tricyclic, Ur Screen: NOT DETECTED

## 2017-09-16 LAB — CBC WITH DIFFERENTIAL/PLATELET
Basophils Absolute: 0.1 10*3/uL (ref 0–0.1)
Basophils Relative: 1 %
Eosinophils Absolute: 0.4 10*3/uL (ref 0–0.7)
Eosinophils Relative: 4 %
HEMATOCRIT: 45.9 % (ref 35.0–47.0)
Hemoglobin: 15.1 g/dL (ref 12.0–16.0)
LYMPHS PCT: 41 %
Lymphs Abs: 4.5 10*3/uL — ABNORMAL HIGH (ref 1.0–3.6)
MCH: 29.9 pg (ref 26.0–34.0)
MCHC: 32.8 g/dL (ref 32.0–36.0)
MCV: 91.2 fL (ref 80.0–100.0)
MONO ABS: 0.6 10*3/uL (ref 0.2–0.9)
Monocytes Relative: 5 %
NEUTROS ABS: 5.5 10*3/uL (ref 1.4–6.5)
Neutrophils Relative %: 49 %
Platelets: 287 10*3/uL (ref 150–440)
RBC: 5.03 MIL/uL (ref 3.80–5.20)
RDW: 13.8 % (ref 11.5–14.5)
WBC: 11.2 10*3/uL — ABNORMAL HIGH (ref 3.6–11.0)

## 2017-09-16 LAB — COMPREHENSIVE METABOLIC PANEL
ALBUMIN: 4.4 g/dL (ref 3.5–5.0)
ALK PHOS: 151 U/L — AB (ref 38–126)
ALT: 365 U/L — ABNORMAL HIGH (ref 14–54)
ANION GAP: 7 (ref 5–15)
AST: 281 U/L — AB (ref 15–41)
BUN: 7 mg/dL (ref 6–20)
CALCIUM: 8.6 mg/dL — AB (ref 8.9–10.3)
CO2: 23 mmol/L (ref 22–32)
Chloride: 112 mmol/L — ABNORMAL HIGH (ref 101–111)
Creatinine, Ser: 0.79 mg/dL (ref 0.44–1.00)
GFR calc Af Amer: >60 mL/min (ref >60)
GFR calc non Af Amer: >60 mL/min (ref >60)
GLUCOSE: 87 mg/dL (ref 65–99)
Potassium: 3.8 mmol/L (ref 3.5–5.1)
SODIUM: 142 mmol/L (ref 135–145)
Total Bilirubin: 0.5 mg/dL (ref 0.3–1.2)
Total Protein: 8.4 g/dL — ABNORMAL HIGH (ref 6.5–8.1)

## 2017-09-16 LAB — ACETAMINOPHEN LEVEL: Acetaminophen (Tylenol), Serum: <10 ug/mL — ABNORMAL LOW (ref 10–30)

## 2017-09-16 LAB — POCT PREGNANCY, URINE: Preg Test, Ur: NEGATIVE

## 2017-09-16 LAB — ETHANOL: Alcohol, Ethyl (B): 170 mg/dL — ABNORMAL HIGH (ref <10)

## 2017-09-16 LAB — SALICYLATE LEVEL: Salicylate Lvl: <7 mg/dL (ref 2.8–30.0)

## 2017-09-16 NOTE — ED Notes (Signed)
Pt states she had "5 bahama mamas and took two  welbutrin pills." Pt denies "taking a handful of welbutrin" stating more "if I did that I wouldn't be awake as I am right now, that supposedly happened 2 or 3 hours ago."

## 2017-09-16 NOTE — ED Triage Notes (Signed)
Pt arrived to the ED via [facility] department under IVC for allegedly taking a hand full of antidepressants. Pt states that she ahs been drinking today and her family thought the she took a "hand full of welbutrin " about 3 hr ago. Pt denies taking a hand full of medication, she admits to taking 2 ( ) pills of welbutrin and drinking.  Pt is AOx4 in no apparent distress, denies SI and HI.

## 2017-09-16 NOTE — ED Notes (Signed)
Dylan (significant other) - [phone]

## 2017-09-16 NOTE — BH Assessment (Signed)
Assessment Note  Karen Brooks is an 27 y.o. female. Karen Brooks arrived to the ED by way of the [facility] department.  She reports that her grandmother thought that she took a handful of Wellbutrin.  She states that she took 2 pills like she was supposed to.  She states that her grandmother got upset with her.  She stated that she is not depressed.  She denied symptoms of anxiety,  She denied having auditory or visual hallucinations.  She denied suicidal ideation or intent.  She denied homicidal ideation or intent.  She denied facing any current stressors. She reports the use of alcohol. Patient was not forhtright with information .  IVC paperwork reports, "Respondent consumed two handfuls of anti-depressant- Bupropion 150 mg.  She is depressed over loss of a child.  She is a danger to herself.  Diagnosis: Depressed  Past Medical History:  Past Medical History:  Diagnosis Date  . Chronic hepatitis C (HCC)   . Gestational hypertension   . Polysubstance abuse (HCC)    MJ, cocaine, heroin  . Stillbirth   . Tobacco use     Past Surgical History:  Procedure Laterality Date  . EXTERNAL EAR SURGERY    . WISDOM TOOTH EXTRACTION      Family History:  Family History  Problem Relation Age of Onset  . Healthy Father   . Asthma Maternal Grandmother   . COPD Paternal Grandmother     Social History:  reports that she has been smoking cigarettes.  She has been smoking about 1.00 pack per day. She has never used smokeless tobacco. She reports that she drinks alcohol. She reports that she has current or past drug history. Drugs: Cocaine and Heroin.  Additional Social History:  Alcohol / Drug Use History of alcohol / drug use?: No history of alcohol / drug abuse  CIWA: CIWA-Ar BP: 132/84 Pulse Rate: 91 COWS:    Allergies:  Allergies  Allergen Reactions  . Latex Rash    Home Medications:  (Not in a hospital admission)  OB/GYN Status:  Patient's last menstrual period was  09/16/2017.  General Assessment Data TTS Assessment: In system Is this a Tele or Face-to-Face Assessment?: Face-to-Face Is this an Initial Assessment or a Re-assessment for this encounter?: Initial Assessment Marital status: Single Maiden name: Venturella Is patient pregnant?: No Pregnancy Status: No Living Arrangements: Other relatives(Grand parents and brother) Can pt return to current living arrangement?: Yes Admission Status: Involuntary Is patient capable of signing voluntary admission?: No Referral Source: Self/Family/Friend Insurance type: Medicaid  Medical Screening Exam [facility] Walk-in ONLY) Medical Exam completed: Yes  Crisis Care Plan Living Arrangements: Other relatives(Grand parents and brother) Legal Guardian: Other:(Self) Name of Psychiatrist: None Name of Therapist: None  Education Status Is patient currently in school?: No Is the patient employed, unemployed or receiving disability?: Unemployed  Risk to self with the past 6 months Suicidal Ideation: No Has patient been a risk to self within the past 6 months prior to admission? : No Suicidal Intent: No Has patient had any suicidal intent within the past 6 months prior to admission? : No Is patient at risk for suicide?: No Suicidal Plan?: No Has patient had any suicidal plan within the past 6 months prior to admission? : No Access to Means: No What has been your use of drugs/alcohol within the last 12 months?: occassional use of alcohol Previous Attempts/Gestures: No How many times?: 0 Other Self Harm Risks: denied Triggers for Past Attempts: None known Intentional Self  Injurious Behavior: None Family Suicide History: No Recent stressful life event(s): (denied) Persecutory voices/beliefs?: No Depression: No Depression Symptoms: (denied) Substance abuse history and/or treatment for substance abuse?: No Suicide prevention information given to non-admitted patients: Not applicable  Risk to Others within the  past 6 months Homicidal Ideation: No Does patient have any lifetime risk of violence toward others beyond the six months prior to admission? : No Thoughts of Harm to Others: No Current Homicidal Intent: No Current Homicidal Plan: No Access to Homicidal Means: No Identified Victim: None identified History of harm to others?: No Assessment of Violence: None Noted Violent Behavior Description: denied Does patient have access to weapons?: No Criminal Charges Pending?: No Does patient have a court date: No Is patient on probation?: Yes  Psychosis Hallucinations: None noted Delusions: None noted  Mental Status Report Appearance/Hygiene: In scrubs Eye Contact: Poor Motor Activity: Unremarkable Speech: Slurred, Slow Level of Consciousness: Drowsy Mood: Euthymic Affect: Appropriate to circumstance Anxiety Level: None Judgement: Partial Obsessive Compulsive Thoughts/Behaviors: None  Cognitive Functioning Concentration: Good Memory: Recent Intact Is patient IDD: No Is patient DD?: No Insight: Poor Impulse Control: Fair Appetite: Good Have you had any weight changes? : No Change Sleep: No Change Vegetative Symptoms: None  ADLScreening [facility] Assessment Services) Patient's cognitive ability adequate to safely complete daily activities?: Yes Independently performs ADLs?: Yes (appropriate for developmental age)  Prior Inpatient Therapy Prior Inpatient Therapy: No  Prior Outpatient Therapy Prior Outpatient Therapy: No Does patient have an ACCT team?: No Does patient have Intensive In-House Services?  : No Does patient have [facility] services? : No Does patient have P4CC services?: No  ADL Screening (condition at time of admission) Patient's cognitive ability adequate to safely complete daily activities?: Yes Is the patient deaf or have difficulty hearing?: No Does the patient have difficulty seeing, even when wearing glasses/contacts?: No Does the patient have difficulty  concentrating, remembering, or making decisions?: No Does the patient have difficulty dressing or bathing?: No Independently performs ADLs?: Yes (appropriate for developmental age) Does the patient have difficulty walking or climbing stairs?: No Weakness of Legs: None Weakness of Arms/Hands: None  Home Assistive Devices/Equipment Home Assistive Devices/Equipment: None    Abuse/Neglect Assessment (Assessment to be complete while patient is alone) Abuse/Neglect Assessment Can Be Completed: (Patient denied a history of abuse.)                Disposition:  Disposition Initial Assessment Completed for this Encounter: Yes  On Site Evaluation by:   Reviewed with Physician:    Justice Deeds 09/16/2017 11:15 PM

## 2017-09-16 NOTE — ED Notes (Signed)
Pt states she had a "still birth in March" and states she has had recurrent depressive emotions since. Pt states she "sometimes drink and sometimes uses cocaine." Pt states at this time she denies SI or plan. Pt given sandwich tray and sprite. Pt resting in bed at this time, appears in NAD.

## 2017-09-17 ENCOUNTER — Other Ambulatory Visit: Payer: Self-pay

## 2017-09-17 DIAGNOSIS — F17210 Nicotine dependence, cigarettes, uncomplicated: Secondary | ICD-10-CM | POA: Diagnosis present

## 2017-09-17 DIAGNOSIS — F53 Postpartum depression: Secondary | ICD-10-CM | POA: Diagnosis present

## 2017-09-17 DIAGNOSIS — Z91040 Latex allergy status: Secondary | ICD-10-CM | POA: Diagnosis not present

## 2017-09-17 DIAGNOSIS — F1410 Cocaine abuse, uncomplicated: Secondary | ICD-10-CM

## 2017-09-17 DIAGNOSIS — T50901A Poisoning by unspecified drugs, medicaments and biological substances, accidental (unintentional), initial encounter: Secondary | ICD-10-CM | POA: Diagnosis present

## 2017-09-17 DIAGNOSIS — T50902A Poisoning by unspecified drugs, medicaments and biological substances, intentional self-harm, initial encounter: Secondary | ICD-10-CM | POA: Diagnosis not present

## 2017-09-17 DIAGNOSIS — F1010 Alcohol abuse, uncomplicated: Secondary | ICD-10-CM

## 2017-09-17 DIAGNOSIS — F1994 Other psychoactive substance use, unspecified with psychoactive substance-induced mood disorder: Secondary | ICD-10-CM

## 2017-09-17 DIAGNOSIS — F1414 Cocaine abuse with cocaine-induced mood disorder: Secondary | ICD-10-CM | POA: Diagnosis present

## 2017-09-17 DIAGNOSIS — R45851 Suicidal ideations: Secondary | ICD-10-CM | POA: Diagnosis not present

## 2017-09-17 DIAGNOSIS — K7010 Alcoholic hepatitis without ascites: Secondary | ICD-10-CM | POA: Diagnosis present

## 2017-09-17 DIAGNOSIS — F419 Anxiety disorder, unspecified: Secondary | ICD-10-CM | POA: Diagnosis present

## 2017-09-17 DIAGNOSIS — Z825 Family history of asthma and other chronic lower respiratory diseases: Secondary | ICD-10-CM | POA: Diagnosis not present

## 2017-09-17 DIAGNOSIS — B182 Chronic viral hepatitis C: Secondary | ICD-10-CM | POA: Diagnosis present

## 2017-09-17 LAB — BASIC METABOLIC PANEL
Anion gap: 7 (ref 5–15)
Anion gap: 9 (ref 5–15)
BUN: 7 mg/dL (ref 6–20)
BUN: 6 mg/dL (ref 6–20)
CALCIUM: 7.9 mg/dL — AB (ref 8.9–10.3)
CHLORIDE: 113 mmol/L — AB (ref 101–111)
CHLORIDE: 113 mmol/L — AB (ref 101–111)
CO2: 20 mmol/L — ABNORMAL LOW (ref 22–32)
CO2: 18 mmol/L — ABNORMAL LOW (ref 22–32)
CREATININE: 0.7 mg/dL (ref 0.44–1.00)
CREATININE: 0.6 mg/dL (ref 0.44–1.00)
Calcium: 8.1 mg/dL — ABNORMAL LOW (ref 8.9–10.3)
GFR calc Af Amer: >60 mL/min (ref >60)
GFR calc non Af Amer: >60 mL/min (ref >60)
GFR calc non Af Amer: >60 mL/min (ref >60)
Glucose, Bld: 126 mg/dL — ABNORMAL HIGH (ref 65–99)
Glucose, Bld: 106 mg/dL — ABNORMAL HIGH (ref 65–99)
Potassium: 3.7 mmol/L (ref 3.5–5.1)
Potassium: 3.5 mmol/L (ref 3.5–5.1)
SODIUM: 140 mmol/L (ref 135–145)
Sodium: 140 mmol/L (ref 135–145)

## 2017-09-17 LAB — HEPATIC FUNCTION PANEL
ALBUMIN: 3.5 g/dL (ref 3.5–5.0)
ALBUMIN: 3.6 g/dL (ref 3.5–5.0)
ALK PHOS: 119 U/L (ref 38–126)
ALT: 277 U/L — ABNORMAL HIGH (ref 14–54)
ALT: 258 U/L — ABNORMAL HIGH (ref 14–54)
AST: 196 U/L — AB (ref 15–41)
AST: 135 U/L — ABNORMAL HIGH (ref 15–41)
Alkaline Phosphatase: 120 U/L (ref 38–126)
BILIRUBIN TOTAL: 0.5 mg/dL (ref 0.3–1.2)
BILIRUBIN TOTAL: 0.8 mg/dL (ref 0.3–1.2)
Bilirubin, Direct: <0.1 mg/dL — ABNORMAL LOW (ref 0.1–0.5)
Bilirubin, Direct: 0.1 mg/dL (ref 0.1–0.5)
Indirect Bilirubin: 0.7 mg/dL (ref 0.3–0.9)
Total Protein: 6.9 g/dL (ref 6.5–8.1)
Total Protein: 7.4 g/dL (ref 6.5–8.1)

## 2017-09-17 LAB — PROTIME-INR
INR: 1.1
Prothrombin Time: 14.1 seconds (ref 11.4–15.2)

## 2017-09-17 LAB — MAGNESIUM
MAGNESIUM: 1.6 mg/dL — AB (ref 1.7–2.4)
Magnesium: 2.5 mg/dL — ABNORMAL HIGH (ref 1.7–2.4)

## 2017-09-17 MED ORDER — SODIUM CHLORIDE 0.9 % IV BOLUS
1000.0000 mL | Freq: Once | INTRAVENOUS | Status: AC
  Administered 2017-09-17: 1000 mL via INTRAVENOUS

## 2017-09-17 MED ORDER — ADULT MULTIVITAMIN W/MINERALS CH
1.0000 | ORAL_TABLET | Freq: Every day | ORAL | Status: DC
  Filled 2017-09-17: qty 1

## 2017-09-17 MED ORDER — ONDANSETRON HCL 4 MG/2ML IJ SOLN
4.0000 mg | Freq: Four times a day (QID) | INTRAMUSCULAR | Status: DC | PRN
Start: 2017-09-17 — End: 2017-09-18

## 2017-09-17 MED ORDER — VITAMIN B-1 100 MG PO TABS
100.0000 mg | ORAL_TABLET | Freq: Every day | ORAL | Status: DC
  Filled 2017-09-17: qty 1

## 2017-09-17 MED ORDER — METOCLOPRAMIDE HCL 5 MG/ML IJ SOLN
10.0000 mg | Freq: Once | INTRAMUSCULAR | Status: AC
  Administered 2017-09-17: 10 mg via INTRAVENOUS
  Filled 2017-09-17: qty 2

## 2017-09-17 MED ORDER — ONDANSETRON HCL 4 MG/2ML IJ SOLN
4.0000 mg | Freq: Once | INTRAMUSCULAR | Status: AC
  Administered 2017-09-17: 4 mg via INTRAVENOUS
  Filled 2017-09-17: qty 2

## 2017-09-17 MED ORDER — LORAZEPAM 2 MG/ML IJ SOLN
1.0000 mg | Freq: Four times a day (QID) | INTRAMUSCULAR | Status: DC | PRN
  Administered 2017-09-17: 1 mg via INTRAVENOUS
  Filled 2017-09-17: qty 1

## 2017-09-17 MED ORDER — LORAZEPAM 1 MG PO TABS: 1.0000 mg | ORAL_TABLET | Freq: Four times a day (QID) | ORAL | Status: DC | PRN

## 2017-09-17 MED ORDER — FOLIC ACID 1 MG PO TABS
1.0000 mg | ORAL_TABLET | Freq: Every day | ORAL | Status: DC
  Filled 2017-09-17: qty 1

## 2017-09-17 MED ORDER — THIAMINE HCL 100 MG/ML IJ SOLN
100.0000 mg | Freq: Every day | INTRAMUSCULAR | Status: DC
  Filled 2017-09-17: qty 2

## 2017-09-17 MED ORDER — ACETYLCYSTEINE 20 % IN SOLN
140.0000 mg/kg | Freq: Once | RESPIRATORY_TRACT | Status: AC
  Administered 2017-09-17: 10100 mg via ORAL
  Filled 2017-09-17 (×2): qty 60

## 2017-09-17 MED ORDER — TRAMADOL HCL 50 MG PO TABS
50.0000 mg | ORAL_TABLET | Freq: Four times a day (QID) | ORAL | Status: DC | PRN
  Administered 2017-09-17: 50 mg via ORAL
  Filled 2017-09-17: qty 1

## 2017-09-17 MED ORDER — NORETHINDRONE 0.35 MG PO TABS: 1.0000 | ORAL_TABLET | Freq: Every day | ORAL | Status: DC

## 2017-09-17 MED ORDER — POTASSIUM CHLORIDE CRYS ER 20 MEQ PO TBCR
40.0000 meq | EXTENDED_RELEASE_TABLET | Freq: Once | ORAL | Status: AC
  Administered 2017-09-17: 40 meq via ORAL
  Filled 2017-09-17: qty 2

## 2017-09-17 MED ORDER — POTASSIUM CHLORIDE CRYS ER 10 MEQ PO TBCR
30.0000 meq | EXTENDED_RELEASE_TABLET | ORAL | Status: DC
  Administered 2017-09-17: 30 meq via ORAL
  Filled 2017-09-17: qty 1

## 2017-09-17 MED ORDER — ENOXAPARIN SODIUM 40 MG/0.4ML ~~LOC~~ SOLN
40.0000 mg | SUBCUTANEOUS | Status: DC
  Filled 2017-09-17: qty 0.4

## 2017-09-17 MED ORDER — LORAZEPAM 2 MG/ML IJ SOLN
1.0000 mg | Freq: Once | INTRAMUSCULAR | Status: AC
  Administered 2017-09-17: 1 mg via INTRAVENOUS
  Filled 2017-09-17: qty 1

## 2017-09-17 MED ORDER — ACETYLCYSTEINE 20 % IN SOLN
70.0000 mg/kg | RESPIRATORY_TRACT | Status: DC
  Administered 2017-09-17 (×3): 5040 mg via ORAL
  Filled 2017-09-17 (×7): qty 30

## 2017-09-17 MED ORDER — ONDANSETRON HCL 4 MG/2ML IJ SOLN
INTRAMUSCULAR | Status: AC
  Administered 2017-09-17: 4 mg via INTRAVENOUS
  Filled 2017-09-17: qty 2

## 2017-09-17 MED ORDER — ONDANSETRON HCL 4 MG/2ML IJ SOLN
4.0000 mg | Freq: Once | INTRAMUSCULAR | Status: AC
  Administered 2017-09-17: 4 mg via INTRAVENOUS

## 2017-09-17 MED ORDER — MAGNESIUM SULFATE 4 GM/100ML IV SOLN
4.0000 g | Freq: Once | INTRAVENOUS | Status: AC
  Administered 2017-09-17: 4 g via INTRAVENOUS
  Filled 2017-09-17: qty 100

## 2017-09-17 MED ORDER — ONDANSETRON HCL 4 MG PO TABS: 4.0000 mg | ORAL_TABLET | Freq: Four times a day (QID) | ORAL | Status: DC | PRN

## 2017-09-17 MED ORDER — SODIUM CHLORIDE 0.9 % IV SOLN
Freq: Once | INTRAVENOUS | Status: AC
  Administered 2017-09-17: 10:00:00 via INTRAVENOUS

## 2017-09-17 NOTE — ED Notes (Signed)
Pt had 1 episode of emesis, EDP notified. See MAR for follow up.

## 2017-09-17 NOTE — ED Notes (Signed)
Per poison control, pt to have EKG in 4 hrs and place pt on monitor for continued monitoring.

## 2017-09-17 NOTE — ED Provider Notes (Signed)
[facility] Emergency Department Provider Note   ____________________________________________   First MD Initiated Contact with Patient 09/16/17 2132     (approximate)  I have reviewed the triage vital signs and the nursing notes.   HISTORY  Chief Complaint Psychiatric Evaluation and Ingestion    HPI Karen Brooks is a 27 y.o. female Who came in under commitment from her family who said she took to him." Wellbutrin. She is supposed to be taking 150 mg a Wellbutrin. Patient herself says she flushed all of them down the toilet because apparently told her that the antidepressants make her mean and angry. She says she took 2 Wellbutrin and flushed the rest down the toilet. We are awaiting to see if we can contact the family. We do not have a phone number at present. Poison control said bicarbonate boluses if the QRS is prolonged and replete potassium and magnesium if the QTc gets prolonged she's should be monitored for 24 hours. She should get every 4 hours EKGs.patient says she feels fine. She says she is not suicidal.   Past Medical History:  Diagnosis Date  . Chronic hepatitis C (HCC)   . Gestational hypertension   . Polysubstance abuse (HCC)    MJ, cocaine, heroin  . Stillbirth   . Tobacco use     Patient Active Problem List   Diagnosis Date Noted  . Postpartum care following vaginal delivery 07/05/2017  . Preterm labor 07/04/2017  . Recurrent UTI 06/24/2017  . Elevated blood pressure affecting pregnancy in third trimester, antepartum 06/14/2017  . Gestational hypertension 06/14/2017  . Polysubstance abuse (HCC) 06/14/2017  . Chronic hepatitis C affecting antepartum care of mother [facility]) 06/14/2017  . Tobacco use affecting pregnancy in first trimester, antepartum 06/14/2017  . Migraines 12/30/2012  . Fibrocystic breast 04/07/2012    Past Surgical History:  Procedure Laterality Date  . EXTERNAL EAR SURGERY    . WISDOM TOOTH EXTRACTION       Prior to Admission medications   Medication Sig Start Date End Date Taking? Authorizing Provider  acetaminophen-codeine (TYLENOL #3) 300-30 MG tablet Take 1-2 tablets by mouth every 8 (eight) hours as needed for moderate pain. 08/30/17  Yes Payton Mccallum, MD  buPROPion (WELLBUTRIN XL) 150 MG 24 hr tablet Take 1 tablet (150 mg total) by mouth daily. 08/21/17  Yes Conard Novak, MD  hydrOXYzine (ATARAX/VISTARIL) 25 MG tablet Take 1 tablet (25 mg total) by mouth every 6 (six) hours. 06/14/17  Yes Farrel Conners, CNM  norethindrone (MICRONOR,CAMILA,ERRIN) 0.35 MG tablet Take 1 tablet (0.35 mg total) by mouth daily. 08/21/17  Yes Conard Novak, MD  cyclobenzaprine (FLEXERIL) 10 MG tablet Take 1 tablet (10 mg total) by mouth at bedtime. Patient not taking: Reported on 08/21/2017 08/02/17   Payton Mccallum, MD  ibuprofen (ADVIL,MOTRIN) 600 MG tablet Take 1 tablet (600 mg total) by mouth every 6 (six) hours as needed. Patient not taking: Reported on 08/21/2017 07/05/17   Tresea Mall, CNM  phenazopyridine (PYRIDIUM) 200 MG tablet Take 1 tablet (200 mg total) by mouth 3 (three) times daily. Patient not taking: Reported on 08/21/2017 08/05/17   Tommie Sams, DO  Prenatal Vit-Fe Fumarate-FA (PRENATAL MULTIVITAMIN) TABS tablet Take 1 tablet by mouth daily at 12 noon. Patient not taking: Reported on 08/21/2017 06/14/17   Farrel Conners, CNM  sertraline (ZOLOFT) 50 MG tablet Take 1 tablet (50 mg total) by mouth daily. Patient not taking: Reported on 09/16/2017 07/11/17 07/11/18  Arnaldo Natal, MD  Allergies Latex  Family History  Problem Relation Age of Onset  . Healthy Father   . Asthma Maternal Grandmother   . COPD Paternal Grandmother     Social History Social History   Tobacco Use  . Smoking status: Current Every Day Smoker    Packs/day: 1.00    Types: Cigarettes  . Smokeless tobacco: Never Used  Substance Use Topics  . Alcohol use: Yes    Frequency: Never    Comment: social   . Drug use: Yes    Types: Cocaine, Heroin    Comment: last use last week    Review of Systems  Constitutional: No fever/chills Eyes: No visual changes. ENT: No sore throat. Cardiovascular: Denies chest pain. Respiratory: Denies shortness of breath. Gastrointestinal: No abdominal pain.  No nausea, no vomiting.  No diarrhea.  No constipation. Genitourinary: Negative for dysuria. Musculoskeletal: Negative for back pain. Skin: Negative for rash. Neurological: Negative for headaches, focal weakness  ____________________________________________   PHYSICAL EXAM:  VITAL SIGNS: ED Triage Vitals  Enc Vitals Group     BP 09/16/17 2104 132/84     Pulse Rate 09/16/17 2104 91     Resp 09/16/17 2104 16     Temp 09/16/17 2104 98.4 F (36.9 C)     Temp Source 09/16/17 2104 Oral     SpO2 09/16/17 2103 98 %     Weight 09/16/17 2104 159 lb (72.1 kg)     Height 09/16/17 2104  (1.626 m)     Head Circumference --      Peak Flow --      Pain Score 09/16/17 2104 0     Pain Loc --      Pain Edu? --      Excl. in GC? --     Constitutional: Alert and oriented. Well appearing and in no acute distress. Eyes: Conjunctivae are normal.  Head: Atraumatic. Nose: No congestion/rhinnorhea. Mouth/Throat: Mucous membranes are moist.  Oropharynx non-erythematous. Neck: No stridor.  Cardiovascular: Normal rate, regular rhythm. Grossly normal heart sounds.  Good peripheral circulation. Respiratory: Normal respiratory effort.  No retractions. Lungs CTAB. Gastrointestinal: Soft and nontender. No distention. No abdominal bruits. No CVA tenderness. Musculoskeletal: No lower extremity tenderness nor edema.  No joint effusions. Neurologic:  Normal speech and language. No gross focal neurologic deficits are appreciated. No gait instability. Skin:  Skin is warm, dry and intact. No rash noted. Psychiatric: Mood and affect are normal. Speech and behavior are  normal.  ____________________________________________   LABS (all labs ordered are listed, but only abnormal results are displayed)  Labs Reviewed  COMPREHENSIVE METABOLIC PANEL - Abnormal; Notable for the following components:      Result Value   Chloride 112 (*)    Calcium 8.6 (*)    Total Protein 8.4 (*)    AST 281 (*)    ALT 365 (*)    Alkaline Phosphatase 151 (*)    All other components within normal limits  ETHANOL - Abnormal; Notable for the following components:   Alcohol, Ethyl (B) 170 (*)    All other components within normal limits  URINE DRUG SCREEN, QUALITATIVE ([facility] ONLY) - Abnormal; Notable for the following components:   Cocaine Metabolite,[place] POSITIVE (*)    All other components within normal limits  CBC WITH DIFFERENTIAL/PLATELET - Abnormal; Notable for the following components:   WBC 11.2 (*)    Lymphs Abs 4.5 (*)    All other components within normal limits  ACETAMINOPHEN LEVEL - Abnormal;  Notable for the following components:   Acetaminophen (Tylenol), Serum <10 (*)    All other components within normal limits  SALICYLATE LEVEL  POCT PREGNANCY, URINE  POC URINE PREG, ED   ____________________________________________  EKG EKG read and interpreted by me shows normal sinus rhythm rate of 86 normal axis normal EKG  ____________________________________________  RADIOLOGY  ED MD interpretation:   Official radiology report(s): No results found.  ____________________________________________   PROCEDURES  Procedure(s) performed:   Procedures  Critical Care performed:   ____________________________________________   INITIAL IMPRESSION / ASSESSMENT AND PLAN / ED COURSE  I have consulted psychiatry in an attempt to see what we can determine about whether she took the Wellbutrin or not. I will maintain her IVC and the time being.         ____________________________________________   FINAL CLINICAL IMPRESSION(S) / ED  DIAGNOSES  Final diagnoses:  Suicidal ideation  per report of family   ED Discharge Orders    None       Note:  This document was prepared using Dragon voice recognition software and may include unintentional dictation errors.    Arnaldo Natal, MD 09/17/17 [phone]

## 2017-09-17 NOTE — H&P (Signed)
[facility] - [place] at [facility]   PATIENT NAME: Karen Brooks    MR#:  [id number]  DATE OF BIRTH:  June 24, 1990  DATE OF ADMISSION:  09/16/2017  PRIMARY CARE PHYSICIAN: [place]   REQUESTING/REFERRING PHYSICIAN: 09/17/2017  CHIEF COMPLAINT:   Overdose on Wellbutrin binge drinking alcohol yesterday. Urine drug screen positive for cocaine HISTORY OF PRESENT ILLNESS:  Karen Brooks  is a 27 y.o. female with a known history of chronic hepatitis C, polysubstance abuse with marijuana cocaine and heroin, alcohol abuse comes to the emergency room after patient had binge drinking alcohol yesterday along with some argument with family members got her aggravated/upset and she took about 10 to 12 pills of Wellbutrin that was recently started. Patient denies any suicidal or homicidal ideation. She was monitored in the emergency room. Poison control was informed by ER physician. She is being admitted for overnight observation for monitoring EKG for QTC interval.  It was also noted to have elevated LFTs from her baseline hands poison control recommended oral medical missed. Tylenol level is negative. Patient denies taking any Tylenol.  PAST MEDICAL HISTORY:   Past Medical History:  Diagnosis Date  . Chronic hepatitis C (HCC)   . Gestational hypertension   . Polysubstance abuse (HCC)    MJ, cocaine, heroin  . Stillbirth   . Tobacco use     PAST SURGICAL HISTOIRY:   Past Surgical History:  Procedure Laterality Date  . EXTERNAL EAR SURGERY    . WISDOM TOOTH EXTRACTION      SOCIAL HISTORY:   Social History   Tobacco Use  . Smoking status: Current Every Day Smoker    Packs/day: 1.00    Types: Cigarettes  . Smokeless tobacco: Never Used  Substance Use Topics  . Alcohol use: Yes    Frequency: Never    Comment: social    FAMILY HISTORY:   Family History  Problem Relation Age of Onset  . Healthy Father   . Asthma Maternal  Grandmother   . COPD Paternal Grandmother     DRUG ALLERGIES:   Allergies  Allergen Reactions  . Latex Rash    REVIEW OF SYSTEMS:  Review of Systems  Constitutional: Negative for chills, fever and weight loss.  HENT: Negative for ear discharge, ear pain and nosebleeds.   Eyes: Negative for blurred vision, pain and discharge.  Respiratory: Negative for sputum production, shortness of breath, wheezing and stridor.   Cardiovascular: Negative for chest pain, palpitations, orthopnea and PND.  Gastrointestinal: Negative for abdominal pain, diarrhea, nausea and vomiting.  Genitourinary: Negative for frequency and urgency.  Musculoskeletal: Negative for back pain and joint pain.  Neurological: Negative for sensory change, speech change, focal weakness and weakness.  Psychiatric/Behavioral: Negative for depression and hallucinations. The patient is not nervous/anxious.      MEDICATIONS AT HOME:   Prior to Admission medications   Medication Sig Start Date End Date Taking? Authorizing Provider  acetaminophen-codeine (TYLENOL #3) 300-30 MG tablet Take 1-2 tablets by mouth every 8 (eight) hours as needed for moderate pain. 08/30/17  Yes Payton Mccallum, MD  buPROPion (WELLBUTRIN XL) 150 MG 24 hr tablet Take 1 tablet (150 mg total) by mouth daily. 08/21/17  Yes Conard Novak, MD  hydrOXYzine (ATARAX/VISTARIL) 25 MG tablet Take 1 tablet (25 mg total) by mouth every 6 (six) hours. 06/14/17  Yes Farrel Conners, CNM  norethindrone (MICRONOR,CAMILA,ERRIN) 0.35 MG tablet Take 1 tablet (0.35 mg total) by mouth daily. 08/21/17  Yes  Conard Novak, MD      VITAL SIGNS:  Blood pressure 128/66, pulse (!) 113, temperature 98.2 F (36.8 C), temperature source Oral, resp. rate 18, height  (1.626 m), weight 72.1 kg (159 lb), last menstrual period 09/16/2017, SpO2 99 %.  PHYSICAL EXAMINATION:  GENERAL:  27 y.o.-year-old patient lying in the bed with no acute distress.  EYES: Pupils equal,  round, reactive to light and accommodation. No scleral icterus. Extraocular muscles intact.  HEENT: Head atraumatic, normocephalic. Oropharynx and nasopharynx clear.  NECK:  Supple, no jugular venous distention. No thyroid enlargement, no tenderness.  LUNGS: Normal breath sounds bilaterally, no wheezing, rales,rhonchi or crepitation. No use of accessory muscles of respiration.  CARDIOVASCULAR: S1, S2 normal. No murmurs, rubs, or gallops.  ABDOMEN: Soft, nontender, nondistended. Bowel sounds present. No organomegaly or mass.  EXTREMITIES: No pedal edema, cyanosis, or clubbing.  NEUROLOGIC: Cranial nerves II through XII are intact. Muscle strength 5/5 in all extremities. Sensation intact. Gait not checked.  PSYCHIATRIC: The patient is alert and oriented x 3.  SKIN: No obvious rash, lesion, or ulcer.   LABORATORY PANEL:   CBC Recent Labs  Lab 09/16/17 2114  WBC 11.2*  HGB 15.1  HCT 45.9  PLT 287   ------------------------------------------------------------------------------------------------------------------  Chemistries  Recent Labs  Lab 09/17/17 0817  NA 140  K 3.7  CL 113*  CO2 20*  GLUCOSE 126*  BUN 7  CREATININE 0.70  CALCIUM 7.9*  MG 1.6*  AST 196*  ALT 277*  ALKPHOS 120  BILITOT 0.5   ------------------------------------------------------------------------------------------------------------------  Cardiac Enzymes No results for input(s): TROPONINI in the last 168 hours. ------------------------------------------------------------------------------------------------------------------  RADIOLOGY:  No results found.  EKG:    IMPRESSION AND PLAN:   Karen Brooks  is a 28 y.o. female with a known history of chronic hepatitis C, polysubstance abuse with marijuana cocaine and heroin, alcohol abuse comes to the emergency room after patient had binge drinking alcohol yesterday along with some argument with family members got her aggravated/upset and she took  about 10 to 12 pills of Wellbutrin that was recently started.  1. overdose with Wellbutrin -patient reports taking about 10 to12 pills of  150 mg of Wellbutrin -she was binge drinking over the holiday weekend with her family got into argument which made her upset and got her to take the pills. She denies taking any other medications including Tylenol -denies suicidal or homicidal ideation -patient was evaluated by tele psych in the ER. IVC has not been initiated -telesitter for safety reasons -EKG Q6 hourly for monitoring QTC interval -Patrick consultation with Dr. Toni Amend. Spoke with him  2. elevated LFTs secondary to alcohol abuse/binge drinking with alcoholic hepatitis -has history of chronic hepatitis has chronically elevated LFTs -poison control recommended patient BMU commenced given history of overdose although patient denies taking Tylenol -to do to monitor LFTs -G.I. consultation if needed.  3. DVT prophylaxis subcu Lovenox  4. Anxiety -refer to psychiatry  No family in the room    All the records are reviewed and case discussed with ED provider. Management plans discussed with the patient, family and they are in agreement.  CODE STATUS: full  TOTAL TIME TAKING CARE OF THIS PATIENT: *45* minutes.    Enedina Finner M.D on 09/17/2017 at 2:47 PM  Between 7am to 6pm - Pager - [phone]  After 6pm go to [email or web address] - password [email or web address] [facility]  [facility]  Office  [phone]  CC: Primary care physician; [facility]

## 2017-09-17 NOTE — ED Notes (Addendum)
This RN spoke with poison control, would like mag and repeat potassium completed. Poison control states they will call back. Per poison control, "optomize K and mag at high set of normal, would like mag at least 2 and potassium to be in the 4's." EDP notified of this.

## 2017-09-17 NOTE — ED Notes (Signed)
Pt's grandmother given update on pt's plan of care at this time, verbalizes understanding. Grandmother given visiting and phone hours.

## 2017-09-17 NOTE — BH Assessment (Addendum)
Per Dr. Shary Key patient doesn't meet criteria for inpatient psychiatric treatment and can be d/c home when medically stable.

## 2017-09-17 NOTE — Consult Note (Signed)
[facility] Consult   Reason for Consult: Consult for 27 year old woman who came to the hospital last night after taking an overdose of Wellbutrin Referring Physician: Posey Pronto Patient Identification: Karen Brooks MRN:  [id number] Principal Diagnosis: Substance induced mood disorder [facility]) Diagnosis:   Patient Active Problem List   Diagnosis Date Noted  . Overdose [T50.901A] 09/17/2017  . Cocaine abuse ([place]) [F14.10] 09/17/2017  . Alcohol abuse [F10.10] 09/17/2017  . Substance induced mood disorder ([place]) [F19.94] 09/17/2017  . Postpartum care following vaginal delivery [Z39.2] 07/05/2017  . Preterm labor [O60.00] 07/04/2017  . Recurrent UTI [N39.0] 06/24/2017  . Elevated blood pressure affecting pregnancy in third trimester, antepartum [O16.3] 06/14/2017  . Gestational hypertension [O13.9] 06/14/2017  . Polysubstance abuse ([place]) [F19.10] 06/14/2017  . Chronic hepatitis C affecting antepartum care of mother [facility]) [W09.811, B18.2] 06/14/2017  . Tobacco use affecting pregnancy in first trimester, antepartum [O99.331] 06/14/2017  . Migraines [G43.909] 12/30/2012  . Fibrocystic breast [N60.19] 04/07/2012    Total Time spent with patient: 1 hour  Subjective:   Karen Brooks is a 27 y.o. female patient admitted with "I had too much to drink is what it is".  HPI: Patient interviewed chart reviewed.  27 year old woman brought to the emergency room yesterday evening by her family after taking an overdose of bupropion.  Patient presented to the ER saying she was uncertain about how many pills she had taken.  She was intoxicated at the time and she has consistently denied any suicidal intent.  On interview today the patient says she remembers having several pills in her hand although she does not remember an exact number.  She does not remember actually taking the pills and she has no memory of having had any suicidal thoughts or intent along with it.  She supposed that she  was probably arguing with her family but cannot remember any of the details of that.  She had been drinking heavily yesterday quite a bit more than she normally does and also the day before had been using cocaine.  Patient reports that when not intoxicated her mood recently has been fine.  Not particularly depressed.  Sleep is usually okay at night.  Denies having had any suicidal thoughts recently.  She is currently going to the intensive outpatient substance abuse program at [facility].  Medical history: Patient was recently diagnosed with hepatitis C.  He has an appointment coming up to go to [facility] to see gastroenterology about it.  Substance abuse history: Significant history of mostly cocaine abuse also intermittent alcohol abuse.  No history of DTs or seizures from alcohol withdrawal.  Currently going to intensive outpatient program.  Past Psychiatric History: Patient is being treated for depression by an outpatient provider and has no history of hospitalizations no history of suicide attempts  Risk to Self: Suicidal Ideation: No Suicidal Intent: No Is patient at risk for suicide?: No Suicidal Plan?: No Access to Means: No What has been your use of drugs/alcohol within the last 12 months?: occassional use of alcohol How many times?: 0 Other Self Harm Risks: denied Triggers for Past Attempts: None known Intentional Self Injurious Behavior: None Risk to Others: Homicidal Ideation: No Thoughts of Harm to Others: No Current Homicidal Intent: No Current Homicidal Plan: No Access to Homicidal Means: No Identified Victim: None identified History of harm to others?: No Assessment of Violence: None Noted Violent Behavior Description: denied Does patient have access to weapons?: No Criminal Charges Pending?: No Does patient have a court  date: No Prior Inpatient Therapy: Prior Inpatient Therapy: No Prior Outpatient Therapy: Prior Outpatient Therapy: No Does patient have an ACCT team?: No Does  patient have Intensive In-House Services?  : No Does patient have [facility] services? : No Does patient have P4CC services?: No  Past Medical History:  Past Medical History:  Diagnosis Date  . Chronic hepatitis C ([place])   . Gestational hypertension   . Polysubstance abuse ([place])    MJ, cocaine, heroin  . Stillbirth   . Tobacco use     Past Surgical History:  Procedure Laterality Date  . EXTERNAL EAR SURGERY    . WISDOM TOOTH EXTRACTION     Family History:  Family History  Problem Relation Age of Onset  . Healthy Father   . Asthma Maternal Grandmother   . COPD Paternal Grandmother    Family Psychiatric  History: Positive for some depression Social History:  Social History   Substance and Sexual Activity  Alcohol Use Yes  . Frequency: Never   Comment: social     Social History   Substance and Sexual Activity  Drug Use Yes  . Types: Cocaine, Heroin   Comment: last use last week    Social History   Socioeconomic History  . Marital status: Single    Spouse name: Not on file  . Number of children: Not on file  . Years of education: Not on file  . Highest education level: Not on file  Occupational History  . Not on file  Social Needs  . Financial resource strain: Not on file  . Food insecurity:    Worry: Not on file    Inability: Not on file  . Transportation needs:    Medical: Not on file    Non-medical: Not on file  Tobacco Use  . Smoking status: Current Every Day Smoker    Packs/day: 1.00    Types: Cigarettes  . Smokeless tobacco: Never Used  Substance and Sexual Activity  . Alcohol use: Yes    Frequency: Never    Comment: social  . Drug use: Yes    Types: Cocaine, Heroin    Comment: last use last week  . Sexual activity: Yes    Partners: Male  Lifestyle  . Physical activity:    Days per week: Not on file    Minutes per session: Not on file  . Stress: Not on file  Relationships  . Social connections:    Talks on phone: Not on file    Gets  together: Not on file    Attends religious service: Not on file    Active member of club or organization: Not on file    Attends meetings of clubs or organizations: Not on file    Relationship status: Not on file  Other Topics Concern  . Not on file  Social History Narrative  . Not on file   Additional Social History:    Allergies:   Allergies  Allergen Reactions  . Latex Rash    Labs:  Results for orders placed or performed during the hospital encounter of 09/16/17 (from the past 48 hour(s))  Comprehensive metabolic panel     Status: Abnormal   Collection Time: 09/16/17  9:14 PM  Result Value Ref Range   Sodium 142 135 - 145 mmol/L   Potassium 3.8 3.5 - 5.1 mmol/L   Chloride 112 (H) 101 - 111 mmol/L   CO2 23 22 - 32 mmol/L   Glucose, Bld 87 65 - 99 mg/dL  BUN 7 6 - 20 mg/dL   Creatinine, Ser 0.79 0.44 - 1.00 mg/dL   Calcium 8.6 (L) 8.9 - 10.3 mg/dL   Total Protein 8.4 (H) 6.5 - 8.1 g/dL   Albumin 4.4 3.5 - 5.0 g/dL   AST 281 (H) 15 - 41 U/L   ALT 365 (H) 14 - 54 U/L   Alkaline Phosphatase 151 (H) 38 - 126 U/L   Total Bilirubin 0.5 0.3 - 1.2 mg/dL   GFR calc non Af Amer >60 >60 mL/min   GFR calc Af Amer >60 >60 mL/min    Comment: (NOTE) The eGFR has been calculated using the CKD EPI equation. This calculation has not been validated in all clinical situations. eGFR's persistently <60 mL/min signify possible Chronic Kidney Disease.    Anion gap 7 5 - 15    Comment: Performed at [facility], [address] [place], [place] [address]  Ethanol     Status: Abnormal   Collection Time: 09/16/17  9:14 PM  Result Value Ref Range   Alcohol, Ethyl (B) 170 (H) <10 mg/dL    Comment: (NOTE) Lowest detectable limit for serum alcohol is 10 mg/dL. For medical purposes only. Performed at [facility], [address] [place], [place] [address]   Urine Drug Screen, Qualitative     Status: Abnormal   Collection Time: 09/16/17  9:14 PM  Result Value Ref  Range   Tricyclic, Ur Screen NONE DETECTED NONE DETECTED   Amphetamines, Ur Screen NONE DETECTED NONE DETECTED   MDMA (Ecstasy)Ur Screen NONE DETECTED NONE DETECTED   Cocaine Metabolite,Ur [place] POSITIVE (A) NONE DETECTED   Opiate, Ur Screen NONE DETECTED NONE DETECTED   Phencyclidine (PCP) Ur S NONE DETECTED NONE DETECTED   Cannabinoid 50 Ng, Ur [place] NONE DETECTED NONE DETECTED   Barbiturates, Ur Screen NONE DETECTED NONE DETECTED   Benzodiazepine, Ur Scrn NONE DETECTED NONE DETECTED   Methadone Scn, Ur NONE DETECTED NONE DETECTED    Comment: (NOTE) Tricyclics + metabolites, urine    Cutoff 1000 ng/mL Amphetamines + metabolites, urine  Cutoff 1000 ng/mL MDMA (Ecstasy), urine              Cutoff 500 ng/mL Cocaine Metabolite, urine          Cutoff 300 ng/mL Opiate + metabolites, urine        Cutoff 300 ng/mL Phencyclidine (PCP), urine         Cutoff 25 ng/mL Cannabinoid, urine                 Cutoff 50 ng/mL Barbiturates + metabolites, urine  Cutoff 200 ng/mL Benzodiazepine, urine              Cutoff 200 ng/mL Methadone, urine                   Cutoff 300 ng/mL The urine drug screen provides only a preliminary, unconfirmed analytical test result and should not be used for non-medical purposes. Clinical consideration and professional judgment should be applied to any positive drug screen result due to possible interfering substances. A more specific alternate chemical method must be used in order to obtain a confirmed analytical result. Gas chromatography / mass spectrometry (GC/MS) is the preferred confirmat ory method. Performed at [facility], [address] [place], [place] [address]   CBC with Diff     Status: Abnormal   Collection Time: 09/16/17  9:14 PM  Result Value Ref Range   WBC 11.2 (H)  3.6 - 11.0 K/uL   RBC 5.03 3.80 - 5.20 MIL/uL   Hemoglobin 15.1 12.0 - 16.0 g/dL   HCT 45.9 35.0 - 47.0 %   MCV 91.2 80.0 - 100.0 fL   MCH 29.9 26.0 - 34.0 pg   MCHC 32.8  32.0 - 36.0 g/dL   RDW 13.8 11.5 - 14.5 %   Platelets 287 150 - 440 K/uL   Neutrophils Relative % 49 %   Neutro Abs 5.5 1.4 - 6.5 K/uL   Lymphocytes Relative 41 %   Lymphs Abs 4.5 (H) 1.0 - 3.6 K/uL   Monocytes Relative 5 %   Monocytes Absolute 0.6 0.2 - 0.9 K/uL   Eosinophils Relative 4 %   Eosinophils Absolute 0.4 0 - 0.7 K/uL   Basophils Relative 1 %   Basophils Absolute 0.1 0 - 0.1 K/uL    Comment: Performed at [facility], [address] [place], [place] [address]  Pregnancy, urine POC     Status: None   Collection Time: 09/16/17  9:22 PM  Result Value Ref Range   Preg Test, Ur NEGATIVE NEGATIVE    Comment:        THE SENSITIVITY OF THIS METHODOLOGY IS >24 mIU/mL   Acetaminophen level     Status: Abnormal   Collection Time: 09/16/17  9:40 PM  Result Value Ref Range   Acetaminophen (Tylenol), Serum <10 (L) 10 - 30 ug/mL    Comment: (NOTE) Therapeutic concentrations vary significantly. A range of 10-30 ug/mL  may be an effective concentration for many patients. However, some  are best treated at concentrations outside of this range. Acetaminophen concentrations >150 ug/mL at 4 hours after ingestion  and >50 ug/mL at 12 hours after ingestion are often associated with  toxic reactions. Performed at [facility], [address] [place], [place] [address]   Salicylate level     Status: None   Collection Time: 09/16/17  9:40 PM  Result Value Ref Range   Salicylate Lvl <6.7 2.8 - 30.0 mg/dL    Comment: Performed at [facility], [address] [place], [place] [address]  Basic metabolic panel     Status: Abnormal   Collection Time: 09/17/17  8:17 AM  Result Value Ref Range   Sodium 140 135 - 145 mmol/L   Potassium 3.7 3.5 - 5.1 mmol/L   Chloride 113 (H) 101 - 111 mmol/L   CO2 20 (L) 22 - 32 mmol/L   Glucose, Bld 126 (H) 65 - 99 mg/dL   BUN 7 6 - 20 mg/dL   Creatinine, Ser 0.70 0.44 - 1.00 mg/dL   Calcium 7.9 (L) 8.9 - 10.3 mg/dL   GFR  calc non Af Amer >60 >60 mL/min   GFR calc Af Amer >60 >60 mL/min    Comment: (NOTE) The eGFR has been calculated using the CKD EPI equation. This calculation has not been validated in all clinical situations. eGFR's persistently <60 mL/min signify possible Chronic Kidney Disease.    Anion gap 7 5 - 15    Comment: Performed at [facility], [address] [place], [place] [address]  Magnesium     Status: Abnormal   Collection Time: 09/17/17  8:17 AM  Result Value Ref Range   Magnesium 1.6 (L) 1.7 - 2.4 mg/dL    Comment: Performed at [facility], [address], [place], [place] [address]  Protime-INR     Status: None   Collection Time: 09/17/17  8:17 AM  Result Value Ref Range   Prothrombin Time 14.1 11.4 - 15.2 seconds   INR 1.10     Comment: Performed at [facility], [address] [place], [place] [address]  Hepatic function panel     Status: Abnormal   Collection Time: 09/17/17  8:17 AM  Result Value Ref Range   Total Protein 6.9 6.5 - 8.1 g/dL   Albumin 3.5 3.5 - 5.0 g/dL   AST 196 (H) 15 - 41 U/L   ALT 277 (H) 14 - 54 U/L   Alkaline Phosphatase 120 38 - 126 U/L   Total Bilirubin 0.5 0.3 - 1.2 mg/dL   Bilirubin, Direct <0.1 (L) 0.1 - 0.5 mg/dL   Indirect Bilirubin NOT CALCULATED 0.3 - 0.9 mg/dL    Comment: Performed at [facility], [address] [place],  [address]    Current Facility-Administered Medications  Medication Dose Route Frequency Provider Last Rate Last Dose  . acetylcysteine (MUCOMYST) 20 % nebulizer / oral solution 5,040 mg  70 mg/kg Oral Q4H Fritzi Mandes, MD   5,040 mg at 09/17/17 1403  . enoxaparin (LOVENOX) injection 40 mg  40 mg Subcutaneous Q24H Fritzi Mandes, MD      . folic acid (FOLVITE) tablet 1 mg  1 mg Oral Daily Fritzi Mandes, MD      . LORazepam (ATIVAN) tablet 1 mg  1 mg Oral Q6H PRN Fritzi Mandes, MD       Or  . LORazepam (ATIVAN) injection 1 mg  1 mg Intravenous Q6H PRN Fritzi Mandes, MD      .  multivitamin with minerals tablet 1 tablet  1 tablet Oral Daily Fritzi Mandes, MD      . ondansetron [facility]) tablet 4 mg  4 mg Oral Q6H PRN Fritzi Mandes, MD       Or  . ondansetron [facility]) injection 4 mg  4 mg Intravenous Q6H PRN Fritzi Mandes, MD      . thiamine (VITAMIN B-1) tablet 100 mg  100 mg Oral Daily Fritzi Mandes, MD       Or  . thiamine (B-1) injection 100 mg  100 mg Intravenous Daily Fritzi Mandes, MD      . traMADol [facility]at[facility]) tablet 50 mg  50 mg Oral Q6H PRN Fritzi Mandes, MD        Musculoskeletal: Strength & Muscle Tone: within normal limits Gait & Station: normal Patient leans: N/A  Psychiatric Specialty Exam: Physical Exam  Nursing note and vitals reviewed. Constitutional: She appears well-developed and well-nourished.  HENT:  Head: Normocephalic and atraumatic.  Eyes: Pupils are equal, round, and reactive to light. Conjunctivae are normal.  Neck: Normal range of motion.  Cardiovascular: Regular rhythm and normal heart sounds.  Respiratory: Effort normal. No respiratory distress.  GI: Soft.  Musculoskeletal: Normal range of motion.  Neurological: She is alert.  Skin: Skin is warm and dry.  Psychiatric: Judgment normal. Her affect is blunt. Her speech is delayed. She is slowed. Thought content is not paranoid and not delusional. She expresses no homicidal and no suicidal ideation. She exhibits abnormal recent memory.    Review of Systems  Constitutional: Negative.   HENT: Negative.   Eyes: Negative.   Respiratory: Negative.   Cardiovascular: Negative.   Gastrointestinal: Negative.   Musculoskeletal: Negative.   Skin: Negative.   Neurological: Negative.   Psychiatric/Behavioral: Positive for memory loss and substance abuse. Negative for depression, hallucinations and suicidal ideas. The patient is not nervous/anxious and does not have insomnia.  Blood pressure 128/66, pulse (!) 113, temperature 98.2 F (36.8 C), temperature source Oral, resp. rate 18, height _0   (1.626 m), weight 72.1 kg (159 lb), last menstrual period 09/16/2017, SpO2 99 %.Body mass index is 27.29 kg/m.  General Appearance: Disheveled  Eye Contact:  Fair  Speech:  Slow  Volume:  Decreased  Mood:  Euthymic  Affect:  Constricted  Thought Process:  Goal Directed  Orientation:  Full (Time, Place, and Person)  Thought Content:  Logical  Suicidal Thoughts:  No  Homicidal Thoughts:  No  Memory:  Immediate;   Fair Recent;   Poor Remote;   Fair  Judgement:  Fair  Insight:  Fair  Psychomotor Activity:  Decreased  Concentration:  Concentration: Fair  Recall:  Poor  Fund of Knowledge:  Fair  Language:  Fair  Akathisia:  No  Handed:  Right  AIMS (if indicated):     Assets:  Desire for Improvement Housing Physical Health Resilience Social Support  ADL's:  Intact  Cognition:  WNL  Sleep:        Treatment Plan Summary: Plan This is a 27 year old woman with a history of substance abuse problems he comes into the hospital after taking an overdose of Wellbutrin.  Patient has no memory of having had any suicidal thought.  No report of recent major depression.  She was very intoxicated at the time.  Patient is currently denying suicidal thoughts denying depression.  She is expressing an appropriate understanding of the problems with substance abuse and says that she has every intention to continue following up with intensive outpatient.  Patient does not require psychiatric hospitalization.  Psychoeducation and review of treatment and encouragement about substance abuse treatment.  Discontinue the sitter.  Patient can be discharged when she is medically stable.  Disposition: No evidence of imminent risk to self or others at present.   Patient does not meet criteria for psychiatric inpatient admission. Supportive therapy provided about ongoing stressors. Discussed crisis plan, support from social network, calling 911, coming to the Emergency Department, and calling Suicide  Hotline.  Alethia Berthold, MD 09/17/2017 3:48 PM

## 2017-09-17 NOTE — ED Notes (Signed)
Report given to SOC 

## 2017-09-17 NOTE — ED Notes (Signed)
2x IV attempt by this RN unsuccessful, will follow up with Charge RN.

## 2017-09-17 NOTE — ED Notes (Signed)
Pt talking with SOC at this time.  

## 2017-09-17 NOTE — Progress Notes (Signed)
MEDICATION RELATED CONSULT NOTE - INITIAL   Pharmacy Consult for electrolyte management Indication: hypomagnesemia and possible overdose (Wellbutrin/APAP)  Allergies  Allergen Reactions  . Latex Rash    Patient Measurements: Height:  (162.6 cm) Weight: 159 lb (72.1 kg) IBW/kg (Calculated) : 54.7 Adjusted Body Weight:   Vital Signs: Temp: 98.4 F (36.9 C) (05/27 2104) Temp Source: Oral (05/27 2104) BP: 145/84 (05/28 0830) Pulse Rate: 120 (05/28 0830) Intake/Output from previous day: No intake/output data recorded. Intake/Output from this shift: No intake/output data recorded.  Labs: Recent Labs    09/16/17 2114 09/17/17 0817  WBC 11.2*  --   HGB 15.1  --   HCT 45.9  --   PLT 287  --   CREATININE 0.79 0.70  MG  --  1.6*  ALBUMIN 4.4  --   PROT 8.4*  --   AST 281*  --   ALT 365*  --   ALKPHOS 151*  --   BILITOT 0.5  --    Estimated Creatinine Clearance: 102.9 mL/min (by C-G formula based on SCr of 0.7 mg/dL).   Microbiology: No results found for this or any previous visit (from the past 720 hour(s)).  Medical History: Past Medical History:  Diagnosis Date  . Chronic hepatitis C (HCC)   . Gestational hypertension   . Polysubstance abuse ([facility])    MJ, cocaine, heroin  . Stillbirth   . Tobacco use     Medications:  Infusions:  . magnesium sulfate 1 - 4 g bolus IVPB      Assessment: 26 yof with possible overdose of Wellbutrin. Poison control recommends repleting K >4 and Mg >2 due to possible EKG changes. Pharmacy consulted to manage electrolytes.  Goal of Therapy:  K 4 to 5 Ca 8.9 to 10.3 Mg 2 to 2.4 Phos 2.5 to 4.6  Plan:  Give potassium chloride 40 mEq po x 1 and magnesium sulfate 4 gm IV x 1. Recheck BMP and Mg this evening at 18:00. Recheck all electrolytes tomorrow with AM labs.  Carola Frost, Pharm.D., BCPS Clinical Pharmacist 09/17/2017,8:56 AM

## 2017-09-17 NOTE — Progress Notes (Signed)
MEDICATION RELATED CONSULT NOTE - INITIAL   Pharmacy Consult for electrolyte management Indication: hypomagnesemia and possible overdose (Wellbutrin/APAP)  Allergies  Allergen Reactions  . Latex Rash    Patient Measurements: Height:  (162.6 cm) Weight: 159 lb (72.1 kg) IBW/kg (Calculated) : 54.7 Adjusted Body Weight:   Vital Signs: Temp: 98.2 F (36.8 C) (05/28 1410) Temp Source: Oral (05/28 1410) BP: 128/66 (05/28 1410) Pulse Rate: 113 (05/28 1410) Intake/Output from previous day: No intake/output data recorded. Intake/Output from this shift: No intake/output data recorded.  Labs: Recent Labs    09/16/17 2114 09/17/17 0817 09/17/17 1623 09/17/17 1909  WBC 11.2*  --   --   --   HGB 15.1  --   --   --   HCT 45.9  --   --   --   PLT 287  --   --   --   CREATININE 0.79 0.70  --  0.60  MG  --  1.6*  --  2.5*  ALBUMIN 4.4 3.5 3.6  --   PROT 8.4* 6.9 7.4  --   AST 281* 196* 135*  --   ALT 365* 277* 258*  --   ALKPHOS 151* 120 119  --   BILITOT 0.5 0.5 0.8  --   BILIDIR  --  <0.1* 0.1  --   IBILI  --  NOT CALCULATED 0.7  --    Estimated Creatinine Clearance: 102.9 mL/min (by C-G formula based on SCr of 0.6 mg/dL).   Microbiology: No results found for this or any previous visit (from the past 720 hour(s)).  Medical History: Past Medical History:  Diagnosis Date  . Chronic hepatitis C (HCC)   . Gestational hypertension   . Polysubstance abuse (HCC)    MJ, cocaine, heroin  . Stillbirth   . Tobacco use     Medications:  Infusions:    Assessment: 74 yof with possible overdose of Wellbutrin. Poison control recommends repleting K >4 and Mg >2 due to possible EKG changes. Pharmacy consulted to manage electrolytes.  Goal of Therapy:  K 4 to 5 Ca 8.9 to 10.3 Mg 2 to 2.4 Phos 2.5 to 4.6  Plan:  05/28 1909 K: 3.5. Will order KCL 30 mEq x 2 dose.  F/U with am labs. Pharmacy will continue to replace as needed.   Mahin Guardia M Merilee Wible, Pharm.D.,  BCPS Clinical Pharmacist 09/17/2017,7:44 PM

## 2017-09-17 NOTE — Plan of Care (Signed)
  Problem: Education: Goal: Knowledge of General Education information will improve Outcome: Progressing   Problem: Health Behavior/Discharge Planning: Goal: Ability to manage health-related needs will improve Outcome: Progressing   Problem: Clinical Measurements: Goal: Will remain free from infection Outcome: Progressing   

## 2017-09-17 NOTE — ED Provider Notes (Signed)
-----------------------------------------   1:04 AM on 09/17/2017 -----------------------------------------  Assumed care for patient with possible overdose of Wellbutrin.  Per poison control, recommend 24-hour medical observation with EKG every 4 hours to monitor QTC.  Patient is tachycardic, anxious, just got up to use the commode.  Will administer IV fluids.   ED ECG REPORT I, [name], the attending physician, personally viewed and interpreted this ECG.   Date: 09/17/2017  EKG Time: 0051  Rate: 116  Rhythm: sinus tachycardia  Axis: Normal  Intervals: QTc 473  ST&T Change: Nonspecific   ----------------------------------------- 4:54 AM on 09/17/2017 -----------------------------------------  ED ECG REPORT I, [name], the attending physician, personally viewed and interpreted this ECG.   Date: 09/17/2017  EKG Time: 0451  Rate: 108  Rhythm: sinus tachycardia  Axis: Normal  Intervals: QTc 480  ST&T Change: Nonspecific   ----------------------------------------- 6:34 AM on 09/17/2017 -----------------------------------------  Patient was evaluated by [name] psychiatrist Dr. [name] who has rescinded her IVC and deems her psychiatrically stable for discharge home.  From a medical standpoint, patient's 24-hour medical observation will be done at 8 PM.  Over the course of her 3 EKGs, her QTCs have increased slightly.  Poison control recommends repeating potassium and obtaining magnesium level.  They recommend repleteting these as needed. No indication for sodium bicarbonate currently.   ----------------------------------------- 7:09 AM on 09/17/2017 -----------------------------------------  Poison control faxed recommendations for starting Mucomyst.  Per toxicologist, patient's transaminases are higher today than previously.  Although acetaminophen level is unremarkable, there is a concern patient may have overdosed on acetaminophen.  I have asked the pharmacist to initiate  Mucomyst protocol.  Discussed with hospitalist Dr. Allena Katz who will evaluate patient in the emergency department for admission.     [name], MD 09/17/17 [phone]

## 2017-09-17 NOTE — Progress Notes (Signed)
Notified by NT that patient was leaving the floor to go smoke, I followed her to elevators and asked patient to return to room as she in on tele. Per patient was going outside to go smoke. Patient educated on the risk of smoking and is not allowed to go outside since she is on tele 24/7. Patient agreed to return to room. Will continue to monitor.

## 2017-09-17 NOTE — Plan of Care (Signed)

## 2017-09-17 NOTE — ED Notes (Signed)
Pt notified of need for another EKG, pt verbalizes understanding. Pt asking questions about monitor, pt with equal and unlabored resp noted.

## 2017-09-17 NOTE — Progress Notes (Signed)
MEDICATION RELATED CONSULT NOTE - INITIAL   Pharmacy Consult for n-acetylcysteine Indication: possible APAP overdose/coingestion in setting of Wellbutrin overdose  Allergies  Allergen Reactions  . Latex Rash    Patient Measurements: Height:  (162.6 cm) Weight: 159 lb (72.1 kg) IBW/kg (Calculated) : 54.7 Adjusted Body Weight:   Vital Signs: Temp: 98.4 F (36.9 C) (05/27 2104) Temp Source: Oral (05/27 2104) BP: 138/80 (05/28 0309) Pulse Rate: 113 (05/28 0309) Intake/Output from previous day: No intake/output data recorded. Intake/Output from this shift: No intake/output data recorded.  Labs: Recent Labs    09/16/17 2114  WBC 11.2*  HGB 15.1  HCT 45.9  PLT 287  CREATININE 0.79  ALBUMIN 4.4  PROT 8.4*  AST 281*  ALT 365*  ALKPHOS 151*  BILITOT 0.5   Estimated Creatinine Clearance: 102.9 mL/min (by C-G formula based on SCr of 0.79 mg/dL).   Microbiology: No results found for this or any previous visit (from the past 720 hour(s)).  Medical History: Past Medical History:  Diagnosis Date  . Chronic hepatitis C (HCC)   . Gestational hypertension   . Polysubstance abuse (HCC)    MJ, cocaine, heroin  . Stillbirth   . Tobacco use     Medications:  Infusions:    Assessment: 72 yof with recent history of preterm labor with elevated transaminases and PMH chronic HCV, gestational hypertension, polysubstance abuse, stillbirth, tobacco use presents to ED with family complaint for psychiatric evaluation and ingestion. Possible Wellbutrin and acetaminophen overdose. Poison control has been contacted and recommend sodium bicarbonate boluses for QRS prolongation, replete K >4 and Mg >2, oral N-AC. Patient is no longer on IVC. Pharmacy consulted to manage acetylcysteine.  Goal of Therapy:  AST/ALT WNL APAP <10  Plan:  Start oral n-acetylcysteine 140 mg/kg po x 1 followed by 70 mg/kg po Q4H. Will recheck AST/ALT, PT/INR, and APAP 22 hours after administration of  loading dose per protocol. Poison control recommendations for EKG and electrolytes have been negotiated by medical and nursing staff.  Carola Frost, Pharm.D., BCPS Clinical Pharmacist 09/17/2017,7:24 AM

## 2017-09-17 NOTE — ED Notes (Signed)
[place] (Grandmother): [phone]

## 2017-09-17 NOTE — ED Notes (Signed)
Telepsych device placed in rm 

## 2017-09-17 NOTE — ED Notes (Addendum)
Pt asked again what she ingested PTA, pt states "I took either 2 or 3 welbutrin and 1 hydroxyzine." Pt denies taking a handful of welbutrin and states she flushed them down the toilet because her and her grandmother got into an argument about the medication she is on. Pt states the welbutrin has "helped make me calmer, I use to be on a different medication but it made me mean, this doesn't do that to me." Pt resting in bed at this time, pt appears in NAD and has call bell at bedside.

## 2017-09-17 NOTE — ED Notes (Signed)
Pt given more ice water per request

## 2017-09-17 NOTE — Progress Notes (Signed)
Received verbal orders by Dr. Allena Katz to d/c IVC and order tele sitter. Will continue to monitor.

## 2017-09-17 NOTE — Progress Notes (Signed)
2230 09/17/2017 Dr. Caryn Bee was notified of patient's decision to leave AMA. The nursing supervisor attended to patient at the bedside and educated her about not leaving AMA. AMA paper was presented to patient on request.   2245 09/17/2017  Patient signed the [facility] paper and left.

## 2017-09-18 LAB — HEPATIC FUNCTION PANEL
ALBUMIN: 3.5 g/dL (ref 3.5–5.0)
ALK PHOS: 109 U/L (ref 38–126)
ALT: 241 U/L — ABNORMAL HIGH (ref 14–54)
AST: 122 U/L — AB (ref 15–41)
Bilirubin, Direct: <0.1 mg/dL — ABNORMAL LOW (ref 0.1–0.5)
Total Bilirubin: 0.8 mg/dL (ref 0.3–1.2)
Total Protein: 7.1 g/dL (ref 6.5–8.1)

## 2017-09-24 ENCOUNTER — Ambulatory Visit: Payer: Medicaid Other | Admitting: Obstetrics and Gynecology

## 2017-10-03 NOTE — Discharge Summary (Signed)
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 Chronic hepatitis C (HCC)   . Gestational hypertension   . Polysubstance abuse (HCC)    MJ, cocaine, heroin  . Stillbirth   . Tobacco use     HOSPITAL COURSE:  Karen Brooks is a 27 year old female with known history of chronic hepatitis C polysubstance abuse with marijuana cocaine and heroin along with alcohol abuse was admitted with binge drinking alcohol and argument with family members aggravated took about 10 to 12 pills of Wellbutrin  she was admitted on the medicine floor.tele psychiatry recommended no IVC needed control was informed. Her EKG was monitored for QTC interval patient was doing well. She decided to leave AMA.  CONSULTS OBTAINED:  Treatment Team:  Clapacs, Jackquline Denmark, MD  DRUG ALLERGIES:   Allergies  Allergen Reactions  . Latex Rash    DISCHARGE MEDICATIONS:  none since patient left AMA  If you experience worsening of your admission symptoms, develop shortness of breath, life threatening emergency, suicidal or homicidal thoughts you must seek medical attention immediately by calling 911 or calling your MD immediately  if symptoms less severe.  You Must read complete instructions/literature along with all the possible adverse reactions/side effects for all the Medicines you take and that have been prescribed to you. Take any new Medicines after you have completely  understood and accept all the possible adverse reactions/side effects.   Please note  You were cared for by a hospitalist during your hospital stay. If you have any questions about your discharge medications or the care you received while you were in the hospital after you are discharged, you can call the unit and asked to speak with the hospitalist on call if the hospitalist that took care of you is not available. Once you are discharged, your primary care physician will handle any further medical issues. Please note that NO REFILLS for any discharge medications will be authorized once you are discharged, as it is imperative that you return to your primary care physician (or establish a relationship with a primary care physician if you do not have one) for your aftercare needs so that they can reassess your need for medications and monitor your lab values.   DATA REVIEW:   CBC  No results for input(s): WBC, HGB, HCT, PLT in the last 168 hours.  Chemistries  No results for input(s): NA, K, CL, CO2, GLUCOSE, BUN, CREATININE, CALCIUM, MG, AST, ALT, ALKPHOS, BILITOT in the last 168 hours.  Invalid input(s): GFRCGP  Microbiology Results   No results found for this or any previous visit (from the past 240 hour(s)).  RADIOLOGY:  No results found.   Management plans discussed with the patient, family and they are in agreement.  CODE STATUS:  Code Status History    Date Active Date Inactive Code Status Order ID Comments User Context   09/17/2017 0941 09/18/2017 0306 Full Code [id number]  Enedina Finner, MD Inpatient   07/04/2017 1204 07/05/2017 1835 Full Code [id number]  Oswaldo ConroySchmid, Jacelyn Y, [place] Inpatient   07/04/2017 0902 07/04/2017 1204 Full Code [id number]  Oswaldo ConroySchmid, Jacelyn Y, CNM Inpatient   06/14/2017 1621 06/14/2017 2142 Full Code [id number]  Farrel ConnersGutierrez, Colleen, CNM Inpatient      TOTAL TIME TAKING CARE OF THIS PATIENT: *40* minutes.    Enedina FinnerSona Keen Ewalt M.D on 10/03/2017 at 12:06 PM  Between 7am to  6pm - Pager - [phone] After 6pm go to [email or web address] - Social research officer, governmentpassword [email or web address]  [facility]  Office  [phone]  CC: Primary care physician; [place]err[place]n [place], [facility]

## 2018-04-23 NOTE — L&D Delivery Note (Signed)
 Strip Note  ASSESSMENT & PLAN:  Karen Brooks 28 y.o. G2P0100 at [redacted]w[redacted]d in induced labor.  Gestational Hypertension: No medication. UPC throughout pregnancy without proteinuria. Will continue to monitor blood pressure to assess for intervention, indication for induction.  Single Umbilical Artery: Reviewed 10/13 US  at [redacted]w[redacted]d growth wnl, AFI wnl. F/u growth 11/10 showed adequate growth with AFI wnl, EFW 51%, BPP 8/8.   OB:  - Labor curve reviewed. - Progress: Latent phase - Last SVE:  1800: FT Thick -5, FB placed, miso placed 2230: 3/Thick/-4, Miso#2 placed 0144: Exam deferred, WTSP for deep variables x2 despite repositioning. Patient declined exam at this time and discussed that if there is another change in FHT would recommend checking cervix. Plan to start pitocin  at 4 hour mark given contraction curve and inadequate contractions at this time. Will work with repositioning. Patient in agreement with the plan.  0300: 3/Th/-4, Cervix slightly softer, plan to start pitocin  -  ,  ,  ,   - Pitocin : Dose (milli-units/min) Oxytocin : 8 milli-units/min - Pain management: None  FWB: - Fetal heart tracing reviewed.  -   - No intervention indicated GBS: Negative - Abx:    Peripartum hemorrhage risk assessment:  A) risk factors none  B) low   SUBJECTIVE:  Strip Note  OBJECTIVE:  Vitals: Patient Vitals for the past 4 hrs:  BP Pulse Resp SpO2  04/03/19 0315 133/82 60 18 100 %    EFM:   - Mode: External US   - Baseline Rate: 135  - Variability: Moderate (Between 6 and 25 BPM)  - Characteristics (ie - accels, decels): Pattern: accels, no decels, overall reactive and reassuring. Category 1.  -    Toco:   - Mode: Toco, Palpation  - Contraction Frequency: 1-5 minutes  - (if IUPC in place):

## 2018-04-23 NOTE — L&D Delivery Note (Signed)
-------------------------------------------------------------------------------   Attestation signed by Myrl Therisa Norris, MD at 04/05/19 0017  I was present for the entirety of the procedure(s). Therisa FORBES Myrl, MD  -------------------------------------------------------------------------------  Family Medicine Maternal Child Health  Delivery Note  PRE-OPERATIVE DIAGNOSIS:  1) [redacted]w[redacted]d pregnancy.   POST-OPERATIVE DIAGNOSIS:  1) [redacted]w[redacted]d pregnancy s/p Vaginal, Spontaneous   Delivery Type: Vaginal, Spontaneous   Delivery Clinician: BOGGIANO, VICTORIA L   Delivery Anesthesia: IV Analgesics;Epidural   Labor Complications:  Prolonged second stage, Terminal meconium   Lacerations:  second degree perineal laceration   ESTIMATED BLOOD LOSS: 75 cc  Labor course: This is a 28 y.o. y.o. female G2P0100  who came in at [redacted]w[redacted]d pregnancy induced for gestational HTN.  Her prenatal course was complicated by gestational hypertension not on medications and UP:C throughout pregnancy without proteinuria, single umbilical artery with normal growth scan, polysubstance use disorder neg UDS throughout pregnancy, migraines, chronic hepatitis C with normal LFTs, hx IUFD at [redacted]w[redacted]d, second trimester trichomonal infection, depression, tobacco use, abnormal quad screen 1:139 lost to follow up because patient was incarcerated.  She was admitted to L and D.  Labor course included:  Induction of labor began on 12/10 in the afternoon/evening. Foley bulb and misoprostol were used. Patient did not make significant change and the next morning foley bulb and miso were replaced. After that, patient began to make more change and throughout the afternoon and evening 12/11 patient was on increasing doses of pitocin . She was ruptured on 12/12 AM after epidural was placed. By the early evening of 12/12 patient was complete and started to push. She pushed for four hours after which she delivered a healthy female with terminal meconium  and loose nuchal x1.   Procedure: Vaginal, Spontaneous   Date of birth: 04/04/2019   Time of birth: 10:12 PM    This G2P0100 woman under epidural anesthesia delivered a viable female  infant weighing   with Apgars as listed below.  Delivery was via NSVD  Delivery completed and infant dried and stimulated. Placed on maternal abdomen. Cord clamped at 60 seconds and cut. Cord pH not obtained. Active management of the third stage of labor performed. Intact placenta delivered spontaneously at  . Vulva, vagina, cervix, urethra and rectum explored and second degree perineal noted. Laceration repaired in normal fashion with 3.0 vicryl in 1 layer with good approximation of tissue and hemostasis.  Uterus well contracted at end of delivery.  Mother and infant tolerated delivery well. Attending present and assisted with delivery.   FINDINGS:   1) female infant, Apgar scores of 7   at 1 minute 9   at 5 minutes   2) 3 Vessel Cord  3) Nuchal: yes- loose, reduced at delivery  4) terminal meconium  SPECIMENS: Placenta Discarded; Cord gases Not collected  COMPLICATIONS: Meconium Stained Fluid:Thick  DISPOSITION:  Infant to Newborn Nursery  ATTENDING ATTESTATION: The attending physician was present at the delivery

## 2018-04-23 NOTE — L&D Delivery Note (Signed)
 Strip Note  ASSESSMENT & PLAN:  Karen Brooks 28 y.o. G2P0100 at [redacted]w[redacted]d in induced labor.  gHTN: No medication. UPC throughout pregnancy without proteinuria. Will continue to monitor blood pressure to assess for intervention. One mild range BP @ 2257 153/87, remainder normal range.   Single Umbilical Artery: Reviewed 10/13 US  at [redacted]w[redacted]d growth wnl, AFI wnl. F/u growth 11/10 showed adequate growth with AFI wnl, EFW 51%, BPP 8/8.   OB:  - Labor curve reviewed. - Progress: Latent phase - Last SVE:  1800: FT Thick -5, FB placed, miso placed 2230: 3/Thick/-4, Miso#2 placed 0144: Exam deferred, WTSP for deep variables x2 despite repositioning. Patient declined exam at this time and discussed that if there is another change in FHT would recommend checking cervix. Plan to start pitocin  at 4 hour mark given contraction curve and inadequate contractions at this time. Will work with repositioning. Patient in agreement with the plan.  0300: 3/Th/-4, Cervix slightly softer, plan to start pitocin  0730: strip reviewed, Cat 1 ____________ Signed out to oncoming resident 1000: Re-evaluated patient. More painful contractions with pitocin  at 12mu/min. Patient requesting IV pain medicine. Recheck extremely challenging with patient discomfort appears to be external os ~1cm dilated and internal os fingertip, thick, and hi (confirmed by Dr Jeri around 10:30AM on repeat exam). Discussed with patient, bedside RN and attending - given this, will plan to STOP pit (off at 10:35AM), re-place FB, redose miso if contraction pattern allows given Bishop score 3-4.  1140: FB placed, 1/th/hi, confirmed with digital exam. Patient still c/o contractions but not well traced, will monitor for 20-30 min to determine if miso appropriate. Pit remains off.  1430: FB out 1515: 3/th/-4, no contractions off pit, will dose buccal miso - exam very difficult, hard to get into internal os 2/2 pt discomfort 2200: rare CTX on toco (pt using  push button), <2 in 10 min. Will redose with miso 50mcg orally, discussed with Dr Abram -> per nursing this is not allowed, will order change to 25mcg.  2253: miso 25 actually given 0300: re-eval. Patient resting comfortably. BP also increasingly more elevated over the course of the day (now 150s/90s), though remains mild range and asymptomatic. Will check HELLP labs. Repeat exam confidently 3/20/-3, still a little too high to AROM. Will plan to start pit, epidural, rupture as able.  -  ,  ,  ,   - Pitocin : Dose (milli-units/min) Oxytocin : 0 milli-units/min - Pain management: None  FWB: - Fetal heart tracing reviewed.  -   - No intervention indicated GBS: Negative - Abx:  none  Peripartum hemorrhage risk assessment:  A) risk factors none  B) low   SUBJECTIVE:  Resting comfortably, rare contractions  OBJECTIVE:  Vitals: Patient Vitals for the past 4 hrs:  BP Pulse SpO2  04/04/19 0205 153/97 66 --  04/04/19 0011 138/80 71 --  04/03/19 2335 -- 77 97 %    EFM:   - Mode: Fetal Telemetry  - Baseline Rate: 135  - Variability: Moderate (Between 6 and 25 BPM)  - Characteristics (ie - accels, decels): Pattern: accels, mod variability, Cat 1  -     Toco:   - Mode: Toco  - Contraction Frequency: adjusted(pt lying on her side, difficulty tracing contractions) - not traced   - (if IUPC in place):

## 2018-04-23 NOTE — L&D Delivery Note (Signed)
 Labor Progress Note  ASSESSMENT & PLAN:  Karen Brooks 28 y.o. G2P0100 at [redacted]w[redacted]d in induced labor.  Gestational Hypertension: No medication. UPC throughout pregnancy without proteinuria. Will continue to monitor blood pressure to assess for intervention, indication for induction.  Single Umbilical Artery: Reviewed 10/13 US  at [redacted]w[redacted]d growth wnl, AFI wnl. F/u growth 11/10 showed adequate growth with AFI wnl, EFW 51%, BPP 8/8.   OB:  - Labor curve reviewed. - Progress: Latent phase - Last SVE:  1800: FT Thick -5, FB placed, miso placed 2230: 3/Thick/-4, Miso#2 placed 0144: Exam deferred, WTSP for deep variables x2 despite repositioning. Patient declined exam at this time and discussed that if there is another change in FHT would recommend checking cervix. Plan to start pitocin  at 4 hour mark given contraction curve and inadequate contractions at this time. Will work with repositioning. Patient in agreement with the plan.  0300: 3/Th/-4, Cervix slightly softer, plan to start pitocin  -  ,  ,  ,   - Pitocin : Dose (milli-units/min) Oxytocin : 2 milli-units/min - Pain management: None  FWB: - Fetal heart tracing reviewed.  -   - No intervention indicated GBS: Negative - Abx:    Peripartum hemorrhage risk assessment:  A) risk factors none  B) low   SUBJECTIVE:  Stating she feels fine and feels like she needs to urinate.   OBJECTIVE:  Vitals: Patient Vitals for the past 4 hrs:  BP Temp Temp src Pulse Resp  04/03/19 0045 127/64 36.7 C Oral 78 16    EFM:   - Mode: Fetal Telemetry  - Baseline Rate: 125  - Variability: Moderate (Between 6 and 25 BPM)  - Characteristics (ie - accels, decels): Pattern: Change in baseline from 150s, + Accelerations, no decelerations. Overall reactive and reassuring  -    Toco:   - Mode: Toco, Palpation  - Contraction Frequency: 1.5-4 minutes  - (if IUPC in place):

## 2018-09-26 LAB — OB RESULTS CONSOLE HIV ANTIBODY (ROUTINE TESTING): HIV: NONREACTIVE

## 2018-09-26 LAB — OB RESULTS CONSOLE GC/CHLAMYDIA
Chlamydia: NEGATIVE
Gonorrhea: NEGATIVE

## 2018-09-26 LAB — OB RESULTS CONSOLE RPR: RPR: NONREACTIVE

## 2018-09-26 LAB — OB RESULTS CONSOLE PLATELET COUNT: Platelets: 191

## 2018-09-26 LAB — OB RESULTS CONSOLE ABO/RH: RH Type: POSITIVE

## 2018-09-26 LAB — OB RESULTS CONSOLE ANTIBODY SCREEN: Antibody Screen: NEGATIVE

## 2018-09-26 LAB — OB RESULTS CONSOLE HGB/HCT, BLOOD: Hemoglobin: 14

## 2018-09-26 LAB — OB RESULTS CONSOLE HEPATITIS B SURFACE ANTIGEN: Hepatitis B Surface Ag: NEGATIVE

## 2018-09-26 LAB — OB RESULTS CONSOLE VARICELLA ZOSTER ANTIBODY, IGG: Varicella: IMMUNE

## 2018-11-03 ENCOUNTER — Encounter: Payer: Self-pay | Admitting: Family Medicine

## 2018-11-03 DIAGNOSIS — O0990 Supervision of high risk pregnancy, unspecified, unspecified trimester: Secondary | ICD-10-CM

## 2018-11-03 DIAGNOSIS — O99330 Smoking (tobacco) complicating pregnancy, unspecified trimester: Secondary | ICD-10-CM | POA: Insufficient documentation

## 2018-11-03 DIAGNOSIS — B182 Chronic viral hepatitis C: Secondary | ICD-10-CM

## 2018-11-03 DIAGNOSIS — O2342 Unspecified infection of urinary tract in pregnancy, second trimester: Secondary | ICD-10-CM | POA: Insufficient documentation

## 2018-11-03 DIAGNOSIS — R87618 Other abnormal cytological findings on specimens from cervix uteri: Secondary | ICD-10-CM

## 2018-11-03 DIAGNOSIS — R87619 Unspecified abnormal cytological findings in specimens from cervix uteri: Secondary | ICD-10-CM | POA: Insufficient documentation

## 2018-11-03 DIAGNOSIS — O99320 Drug use complicating pregnancy, unspecified trimester: Secondary | ICD-10-CM

## 2018-11-03 DIAGNOSIS — F339 Major depressive disorder, recurrent, unspecified: Secondary | ICD-10-CM

## 2018-11-03 DIAGNOSIS — O98419 Viral hepatitis complicating pregnancy, unspecified trimester: Secondary | ICD-10-CM

## 2018-11-03 DIAGNOSIS — Z8759 Personal history of other complications of pregnancy, childbirth and the puerperium: Secondary | ICD-10-CM | POA: Insufficient documentation

## 2018-11-03 DIAGNOSIS — O285 Abnormal chromosomal and genetic finding on antenatal screening of mother: Secondary | ICD-10-CM

## 2018-11-03 DIAGNOSIS — A599 Trichomoniasis, unspecified: Secondary | ICD-10-CM

## 2018-11-03 NOTE — Progress Notes (Signed)
Patient was seen for initial prenatal visit at [facility] but then was transferred to [facility] in [place].  [facility] have [facility] to [facility] to assure her abnormal Quad screen is followed up.

## 2019-01-16 DIAGNOSIS — Q270 Congenital absence and hypoplasia of umbilical artery: Secondary | ICD-10-CM | POA: Insufficient documentation

## 2019-01-16 NOTE — Progress Notes (Signed)
Dating info. Abstracted per [facility] records Debera Lat, RN

## 2019-01-20 ENCOUNTER — Other Ambulatory Visit: Payer: Self-pay

## 2019-01-20 ENCOUNTER — Ambulatory Visit: Payer: Self-pay | Admitting: Family Medicine

## 2019-01-20 ENCOUNTER — Ambulatory Visit: Payer: Self-pay

## 2019-01-20 ENCOUNTER — Other Ambulatory Visit: Payer: Self-pay | Admitting: Family Medicine

## 2019-01-20 VITALS — BP 112/70 | Temp 98.5°F | Wt 188.4 lb

## 2019-01-20 DIAGNOSIS — O285 Abnormal chromosomal and genetic finding on antenatal screening of mother: Secondary | ICD-10-CM

## 2019-01-20 DIAGNOSIS — O2342 Unspecified infection of urinary tract in pregnancy, second trimester: Secondary | ICD-10-CM

## 2019-01-20 DIAGNOSIS — Q270 Congenital absence and hypoplasia of umbilical artery: Secondary | ICD-10-CM

## 2019-01-20 DIAGNOSIS — F1410 Cocaine abuse, uncomplicated: Secondary | ICD-10-CM

## 2019-01-20 DIAGNOSIS — B182 Chronic viral hepatitis C: Secondary | ICD-10-CM

## 2019-01-20 DIAGNOSIS — O99330 Smoking (tobacco) complicating pregnancy, unspecified trimester: Secondary | ICD-10-CM

## 2019-01-20 DIAGNOSIS — Z23 Encounter for immunization: Secondary | ICD-10-CM

## 2019-01-20 DIAGNOSIS — O98419 Viral hepatitis complicating pregnancy, unspecified trimester: Secondary | ICD-10-CM

## 2019-01-20 DIAGNOSIS — Z8759 Personal history of other complications of pregnancy, childbirth and the puerperium: Secondary | ICD-10-CM

## 2019-01-20 DIAGNOSIS — A599 Trichomoniasis, unspecified: Secondary | ICD-10-CM

## 2019-01-20 DIAGNOSIS — O0990 Supervision of high risk pregnancy, unspecified, unspecified trimester: Secondary | ICD-10-CM

## 2019-01-20 DIAGNOSIS — O99320 Drug use complicating pregnancy, unspecified trimester: Secondary | ICD-10-CM

## 2019-01-20 NOTE — Progress Notes (Addendum)
   PRENATAL VISIT NOTE  Subjective:  Karen Brooks is a 28 y.o. G2P0100 at [redacted]w[redacted]d being seen today for ongoing prenatal care.  She is currently monitored for the following issues for this high-risk pregnancy and has Polysubstance abuse ([place]); Migraines; Fibrocystic breast; Cocaine abuse ([place]); Chronic hepatitis C affecting pregnancy, antepartum ([place]); History of IUFD; History of pregnancy induced hypertension; Abnormal genetic test during pregnancy; UTI in pregnancy, second trimester; Trichomonal infection; Abnormal Pap smear of cervix; Drug use affecting pregnancy, antepartum; Depression, recurrent ([place]); Tobacco use affecting pregnancy, antepartum; Supervision of high risk pregnancy, antepartum; and Single umbilical artery on their problem list.  Patient reports no complaints.  Contractions: Not present. Vag. Bleeding: None.  Movement: Present. Denies leaking of fluid/ROM.   The following portions of the patient's history were reviewed and updated as appropriate: allergies, current medications, past family history, past medical history, past social history, past surgical history and problem list. Problem list updated.  Objective:   Vitals:   01/20/19 0833  BP: 112/70  Temp: 98.5 F (36.9 C)  Weight: 188 lb 6.4 oz (85.5 kg)    Fetal Status: Fetal Heart Rate (bpm): 145 Fundal Height: 27 cm Movement: Present  Presentation: Transverse  General:  Alert, oriented and cooperative. Patient is in no acute distress.  Skin: Skin is warm and dry. No rash noted.   Cardiovascular: Normal heart rate noted  Respiratory: Normal respiratory effort, no problems with respiration noted  Abdomen: Soft, gravid, appropriate for gestational age.  Pain/Pressure: Absent     Pelvic: Cervical exam deferred        Extremities: Normal range of motion.  Edema: None  Mental Status: Normal mood and affect. Normal behavior. Normal judgment and thought content.   Assessment and Plan:  Pregnancy: G2P0100 at  [redacted]w[redacted]d  1. Single umbilical artery Growth [place] on 10/13  2. Chronic hepatitis C affecting pregnancy, antepartum ([place]) Plan for referral to [facility]  3. History of IUFD Needs NST starting at 34 weeks Plan for IOL at 39 wks Referral to LCSW given grief and likely triggers in pregnancy  4. History of pregnancy induced hypertension - Taking ASA  5. Abnormal genetic test during pregnancy -QUAD pos, other screenings reassuring  6. UTI in pregnancy, second trimester TOC neg per patient  7. Drug use affecting pregnancy, antepartum Not using this pregnancy Reports last use of opiates 09/2016  Last cocaine was in March 2020  8. Tobacco use affecting pregnancy, antepartum Continues to smoke, monitor use  9. Supervision of high risk pregnancy, antepartum Up to date Need records from prison Patient reports having trich treatment  - Flu Vaccine QUAD 36+ mos IM  10. Flu vaccine need  11. Cocaine abuse ([place]) Sober x 6 months Recommended LCSW involvement and patient agrees to referral today - [id number] Drug Screen   Preterm labor symptoms and general obstetric precautions including but not limited to vaginal bleeding, contractions, leaking of fluid and fetal movement were reviewed in detail with the patient. Please refer to After Visit Summary for other counseling recommendations.  Return in about 2 weeks (around 02/03/2019) for Routine prenatal care, 28 wk labs.  Future Appointments  Date Time Provider [place]  02/03/2019  3:20 PM [facility] PROVIDER [facility] None    Caren Macadam, MD

## 2019-01-20 NOTE — Progress Notes (Signed)
Taking PNV and ASA QD. Denies ED/hospital visits since 01/13/2019 when released from prison. Hal Morales, RN Flu vaccine given and tolerated. Hal Morales, RN

## 2019-01-22 LAB — 789231 7+OXYCODONE-BUND
Amphetamines, Urine: NEGATIVE ng/mL
BENZODIAZ UR QL: NEGATIVE ng/mL
Barbiturate screen, urine: NEGATIVE ng/mL
Cannabinoid Quant, Ur: NEGATIVE ng/mL
Cocaine (Metab.): NEGATIVE ng/mL
OPIATE SCREEN URINE: NEGATIVE ng/mL
Oxycodone/Oxymorphone, Urine: NEGATIVE ng/mL
PCP Quant, Ur: NEGATIVE ng/mL

## 2019-02-03 ENCOUNTER — Ambulatory Visit: Payer: Medicaid Other | Admitting: Advanced Practice Midwife

## 2019-02-03 ENCOUNTER — Other Ambulatory Visit: Payer: Self-pay

## 2019-02-03 DIAGNOSIS — Z23 Encounter for immunization: Secondary | ICD-10-CM | POA: Diagnosis not present

## 2019-02-03 DIAGNOSIS — A599 Trichomoniasis, unspecified: Secondary | ICD-10-CM

## 2019-02-03 DIAGNOSIS — B182 Chronic viral hepatitis C: Secondary | ICD-10-CM

## 2019-02-03 DIAGNOSIS — O98419 Viral hepatitis complicating pregnancy, unspecified trimester: Secondary | ICD-10-CM

## 2019-02-03 DIAGNOSIS — Q270 Congenital absence and hypoplasia of umbilical artery: Secondary | ICD-10-CM

## 2019-02-03 DIAGNOSIS — O2342 Unspecified infection of urinary tract in pregnancy, second trimester: Secondary | ICD-10-CM

## 2019-02-03 DIAGNOSIS — O0990 Supervision of high risk pregnancy, unspecified, unspecified trimester: Secondary | ICD-10-CM | POA: Diagnosis not present

## 2019-02-03 DIAGNOSIS — O99330 Smoking (tobacco) complicating pregnancy, unspecified trimester: Secondary | ICD-10-CM

## 2019-02-03 DIAGNOSIS — O99320 Drug use complicating pregnancy, unspecified trimester: Secondary | ICD-10-CM

## 2019-02-03 DIAGNOSIS — Z8759 Personal history of other complications of pregnancy, childbirth and the puerperium: Secondary | ICD-10-CM

## 2019-02-03 LAB — URINALYSIS
Bilirubin, UA: NEGATIVE
Glucose, UA: NEGATIVE
Ketones, UA: NEGATIVE
Nitrite, UA: NEGATIVE
RBC, UA: NEGATIVE
Specific Gravity, UA: 1.03 (ref 1.005–1.030)
Urobilinogen, Ur: 1 mg/dL (ref 0.2–1.0)
pH, UA: 6 (ref 5.0–7.5)

## 2019-02-03 LAB — HEMOGLOBIN, FINGERSTICK: Hemoglobin: 12.9 g/dL (ref 11.1–15.9)

## 2019-02-03 LAB — WET PREP FOR TRICH, YEAST, CLUE
Trichomonas Exam: NEGATIVE
Yeast Exam: NEGATIVE

## 2019-02-03 LAB — OB RESULTS CONSOLE HIV ANTIBODY (ROUTINE TESTING): HIV: NONREACTIVE

## 2019-02-03 NOTE — Progress Notes (Signed)
Patient here for 28 wk labs, 1 hr. Gtt, Tdap. Needs to be to lab by 3:30pm. Patient c/o swelling hands, swelling feet, and headaches. Had U/S at [facility] today, states next U/S 03/03/2019 at 12:30pm. Needs Trich TOC today.Jenetta Downer, RN

## 2019-02-03 NOTE — Progress Notes (Signed)
PRENATAL VISIT NOTE  Subjective:  Karen Brooks is a 28 y.o. G2P0100 at [redacted]w[redacted]d being seen today for ongoing prenatal care.  She is currently monitored for the following issues for this high-risk pregnancy and has Polysubstance abuse ([place]); Migraines; Fibrocystic breast; Cocaine abuse ([place]); Chronic hepatitis C affecting pregnancy, antepartum ([place]); History of IUFD; History of pregnancy induced hypertension; Abnormal genetic test during pregnancy; UTI in pregnancy, second trimester; Trichomonal infection; Abnormal Pap smear of cervix; Drug use affecting pregnancy, antepartum; Depression, recurrent ([place]); Tobacco use affecting pregnancy, antepartum 1/2-1 ppd; Supervision of high risk pregnancy, antepartum; and Single umbilical artery on their problem list.  Patient reports headache and edema.  Contractions: Not present. Vag. Bleeding: None.  Movement: Present. Denies leaking of fluid/ROM.   The following portions of the patient's history were reviewed and updated as appropriate: allergies, current medications, past family history, past medical history, past social history, past surgical history and problem list. Problem list updated.  Objective:   Vitals:   02/03/19 1449  BP: 134/74  Temp: 98 F (36.7 C)  Weight: 191 lb 12.8 oz (87 kg)    Fetal Status: Fetal Heart Rate (bpm): 160 Fundal Height: 28 cm Movement: Present     General:  Alert, oriented and cooperative. Patient is in no acute distress.  Skin: Skin is warm and dry. No rash noted.   Cardiovascular: Normal heart rate noted  Respiratory: Normal respiratory effort, no problems with respiration noted  Abdomen: Soft, gravid, appropriate for gestational age.  Pain/Pressure: Absent     Pelvic: Cervical exam deferred        Extremities: Normal range of motion.  Edema: Mild pitting, slight indentation  Mental Status: Normal mood and affect. Normal behavior. Normal judgment and thought content.   Assessment and Plan:  Pregnancy:  G2P0100 at [redacted]w[redacted]d  1. Single umbilical artery U/s today at [facility].  To begin AP testing at 34 wks  2. Chronic hepatitis C affecting pregnancy, antepartum (HCC) LFT's today  3. History of IUFD At 33 wks.  To begin AP testing at 34 wks  4. History of pregnancy induced hypertension U/a today.  BP=134/74.  Monitor closely  5. Drug use affecting pregnancy, antepartum Pt denies cocaine since 07/2018.  Denies MJ.  Agrees to UDS today  6. Tobacco use affecting pregnancy, antepartum Smoking 1/2-1 ppd.  Counseled on need to stop due to increasing risk for preterm birth  57. Supervision of high risk pregnancy, antepartum C/o daily h/a relieved with Tylenol and sleep.  1+ pretibial edema.  Living between grandmother and mom's house.  Mom present in wheelchair today.  Got out of prison 01/13/19. Pt left building before blood drawn for glucola to take her mom to the car; was told when to return for blooddraw by lab tech  - Hemoglobin, fingerstick - RPR - HIV [place] [facility] - Tdap vaccine greater than or equal to 7yo IM - Glucose tolerance, 1 hour - WET PREP FOR TRICH, YEAST, CLUE - Hepatic function panel - 585277 Drug Screen - Urinalysis (Urine Dip)  8. Trichomonal infection Diagnosed on pap.  FOB was in prison when she was; he was not treated.  Last sex 2 days ago.  TOC today for trich   Preterm labor symptoms and general obstetric precautions including but not limited to vaginal bleeding, contractions, leaking of fluid and fetal movement were reviewed in detail with the patient. Please refer to After Visit Summary for other counseling recommendations.  Return in about 2 weeks (around 02/17/2019)  for routine PNC.  No future appointments.  Alberteen Spindle, CNM

## 2019-02-03 NOTE — Progress Notes (Signed)
Tdap given today, left deltoid, tolerated well, VIS given. Wet mount reviewed, no treatment indicated. Urine dip reviewed by provider, no new orders. Patient to schedule one week appointment. Patient counseled to increase water intake and decrease salt intake. Patient states understanding.Jenetta Downer, RN

## 2019-02-04 LAB — RPR: RPR Ser Ql: NONREACTIVE

## 2019-02-04 LAB — HEPATIC FUNCTION PANEL
ALT: 33 IU/L — ABNORMAL HIGH (ref 0–32)
AST: 27 IU/L (ref 0–40)
Albumin: 3.3 g/dL — ABNORMAL LOW (ref 3.9–5.0)
Alkaline Phosphatase: 100 IU/L (ref 39–117)
Bilirubin Total: 0.2 mg/dL (ref 0.0–1.2)
Bilirubin, Direct: 0.1 mg/dL (ref 0.00–0.40)
Total Protein: 5.9 g/dL — ABNORMAL LOW (ref 6.0–8.5)

## 2019-02-04 LAB — 789231 7+OXYCODONE-BUND
Amphetamines, Urine: NEGATIVE ng/mL
BENZODIAZ UR QL: NEGATIVE ng/mL
Barbiturate screen, urine: NEGATIVE ng/mL
Cannabinoid Quant, Ur: NEGATIVE ng/mL
Cocaine (Metab.): NEGATIVE ng/mL
OPIATE SCREEN URINE: NEGATIVE ng/mL
Oxycodone/Oxymorphone, Urine: NEGATIVE ng/mL
PCP Quant, Ur: NEGATIVE ng/mL

## 2019-02-04 LAB — GLUCOSE, 1 HOUR GESTATIONAL: Gestational Diabetes Screen: 92 mg/dL (ref 65–139)

## 2019-02-10 ENCOUNTER — Ambulatory Visit: Payer: Self-pay

## 2019-02-10 ENCOUNTER — Other Ambulatory Visit: Payer: Self-pay

## 2019-02-10 ENCOUNTER — Ambulatory Visit: Payer: Medicaid Other | Admitting: Advanced Practice Midwife

## 2019-02-10 DIAGNOSIS — Q270 Congenital absence and hypoplasia of umbilical artery: Secondary | ICD-10-CM

## 2019-02-10 DIAGNOSIS — O99330 Smoking (tobacco) complicating pregnancy, unspecified trimester: Secondary | ICD-10-CM

## 2019-02-10 DIAGNOSIS — Z8759 Personal history of other complications of pregnancy, childbirth and the puerperium: Secondary | ICD-10-CM

## 2019-02-10 DIAGNOSIS — O99320 Drug use complicating pregnancy, unspecified trimester: Secondary | ICD-10-CM

## 2019-02-10 DIAGNOSIS — O0990 Supervision of high risk pregnancy, unspecified, unspecified trimester: Secondary | ICD-10-CM

## 2019-02-10 LAB — URINALYSIS
Bilirubin, UA: NEGATIVE
Glucose, UA: NEGATIVE
Ketones, UA: NEGATIVE
Nitrite, UA: NEGATIVE
Protein,UA: NEGATIVE
RBC, UA: NEGATIVE
Specific Gravity, UA: 1.015 (ref 1.005–1.030)
Urobilinogen, Ur: 0.2 mg/dL (ref 0.2–1.0)
pH, UA: 6.5 (ref 5.0–7.5)

## 2019-02-10 NOTE — Progress Notes (Signed)
Here today for 29.5 week MH RV. Taking PNV and ASA QD. Denies ED/hospital visits since last RV. Hal Morales, RN

## 2019-02-10 NOTE — Progress Notes (Signed)
   PRENATAL VISIT NOTE  Subjective:  Karen Brooks is a 28 y.o. G2P0100 at [redacted]w[redacted]d being seen today for ongoing prenatal care.  She is currently monitored for the following issues for this high-risk pregnancy and has Polysubstance abuse ([place]); Migraines; Fibrocystic breast; Cocaine abuse ([place]); Chronic hepatitis C affecting pregnancy, antepartum ([place]); History of IUFD; History of pregnancy induced hypertension; Abnormal genetic test during pregnancy; UTI in pregnancy, second trimester; Trichomonal infection; Abnormal Pap smear of cervix; Drug use affecting pregnancy, antepartum; Depression, recurrent ([place]); Tobacco use affecting pregnancy, antepartum 1/2-1 ppd; Supervision of high risk pregnancy, antepartum; and Single umbilical artery on their problem list.  Patient reports no complaints.  Contractions: Not present. Vag. Bleeding: None.  Movement: Present. Denies leaking of fluid/ROM.   The following portions of the patient's history were reviewed and updated as appropriate: allergies, current medications, past family history, past medical history, past social history, past surgical history and problem list. Problem list updated.  Objective:   Vitals:   02/10/19 0857  BP: 122/77  Temp: (!) 97.3 F (36.3 C)  Weight: 194 lb 6.4 oz (88.2 kg)    Fetal Status: Fetal Heart Rate (bpm): 160 Fundal Height: 30 cm Movement: Present     General:  Alert, oriented and cooperative. Patient is in no acute distress.  Skin: Skin is warm and dry. No rash noted.   Cardiovascular: Normal heart rate noted  Respiratory: Normal respiratory effort, no problems with respiration noted  Abdomen: Soft, gravid, appropriate for gestational age.  Pain/Pressure: Absent     Pelvic: Cervical exam deferred        Extremities: Normal range of motion.  Edema: Trace  Mental Status: Normal mood and affect. Normal behavior. Normal judgment and thought content.   Assessment and Plan:  Pregnancy: G2P0100 at [redacted]w[redacted]d  1.  Single umbilical artery Reviewed 02/03/19 u/s at 28 5/7 with growth wnl, AFI wnl. F/U u/s for growth 03/03/19  2. History of IUFD At 63 1/7--to begin weekly NST's at 48 wks  3. History of pregnancy induced hypertension BP wnl today with neg proteinuria - Urinalysis (Urine Dip)  4. Drug use affecting pregnancy, antepartum Pt denies all drug use including MJ.  Agrees to UDS - 798921 Drug Screen  5. Tobacco use affecting pregnancy, antepartum Smoking 1/2-1 ppd--counseled via 5 A's to stop smoking  6. Supervision of high risk pregnancy, antepartum Not working.  Feels well. Living with paternal grandparents - Urinalysis (Urine Dip)   Preterm labor symptoms and general obstetric precautions including but not limited to vaginal bleeding, contractions, leaking of fluid and fetal movement were reviewed in detail with the patient. Please refer to After Visit Summary for other counseling recommendations.  No follow-ups on file.  No future appointments.  Herbie Saxon, CNM

## 2019-02-11 LAB — 789231 7+OXYCODONE-BUND
Amphetamines, Urine: NEGATIVE ng/mL
BENZODIAZ UR QL: NEGATIVE ng/mL
Barbiturate screen, urine: NEGATIVE ng/mL
Cannabinoid Quant, Ur: NEGATIVE ng/mL
Cocaine (Metab.): NEGATIVE ng/mL
OPIATE SCREEN URINE: NEGATIVE ng/mL
Oxycodone/Oxymorphone, Urine: NEGATIVE ng/mL
PCP Quant, Ur: NEGATIVE ng/mL

## 2019-02-23 ENCOUNTER — Ambulatory Visit
Admission: EM | Admit: 2019-02-23 | Discharge: 2019-02-23 | Disposition: A | Discharge status: Home / Self Care | Payer: Medicaid Other | Attending: Family Medicine | Admitting: Family Medicine

## 2019-02-23 ENCOUNTER — Other Ambulatory Visit: Payer: Self-pay

## 2019-02-23 DIAGNOSIS — O99333 Smoking (tobacco) complicating pregnancy, third trimester: Secondary | ICD-10-CM | POA: Insufficient documentation

## 2019-02-23 DIAGNOSIS — F17210 Nicotine dependence, cigarettes, uncomplicated: Secondary | ICD-10-CM | POA: Insufficient documentation

## 2019-02-23 DIAGNOSIS — O99513 Diseases of the respiratory system complicating pregnancy, third trimester: Secondary | ICD-10-CM | POA: Insufficient documentation

## 2019-02-23 DIAGNOSIS — Z20828 Contact with and (suspected) exposure to other viral communicable diseases: Secondary | ICD-10-CM | POA: Diagnosis not present

## 2019-02-23 DIAGNOSIS — Z91040 Latex allergy status: Secondary | ICD-10-CM | POA: Insufficient documentation

## 2019-02-23 DIAGNOSIS — Z3A31 31 weeks gestation of pregnancy: Secondary | ICD-10-CM | POA: Insufficient documentation

## 2019-02-23 DIAGNOSIS — J069 Acute upper respiratory infection, unspecified: Secondary | ICD-10-CM | POA: Diagnosis not present

## 2019-02-23 NOTE — ED Provider Notes (Signed)
[facility]    CSN: [id number] Arrival date & time: 02/23/19  1316      History   Chief Complaint Chief Complaint  Patient presents with  . Cough    HPI Karen Brooks is a 28 y.o. female.   28 yo female with a c/o cough, nasal congestion, body aches since yesterday. Denies any fevers, chills, chest pains, shortness of breath. Patient is [redacted] weeks pregnant. States has felt baby moving. No known sick contacts.      Past Medical History:  Diagnosis Date  . Chronic hepatitis C (HCC)   . Gestational hypertension   . Polysubstance abuse (HCC)    MJ, cocaine, heroin  . Stillbirth   . Tobacco use     Patient Active Problem List   Diagnosis Date Noted  . Single umbilical artery 01/16/2019  . Chronic hepatitis C affecting pregnancy, antepartum (HCC) 11/03/2018  . History of IUFD 11/03/2018  . History of pregnancy induced hypertension 11/03/2018  . Abnormal genetic test during pregnancy 11/03/2018  . UTI in pregnancy, second trimester 11/03/2018  . Trichomonal infection 11/03/2018  . Abnormal Pap smear of cervix 11/03/2018  . Drug use affecting pregnancy, antepartum 11/03/2018  . Depression, recurrent (HCC) 11/03/2018  . Tobacco use affecting pregnancy, antepartum 1/2-1 ppd 11/03/2018  . Supervision of high risk pregnancy, antepartum 11/03/2018  . Cocaine abuse (HCC) 09/17/2017  . Polysubstance abuse (HCC) 06/14/2017  . Migraines 12/30/2012  . Fibrocystic breast 04/07/2012    Past Surgical History:  Procedure Laterality Date  . EXTERNAL EAR SURGERY    . WISDOM TOOTH EXTRACTION      OB History    Gravida  2   Para  1   Term      Preterm  1   AB      Living  0     SAB      TAB      Ectopic      Multiple  0   Live Births               Home Medications    Prior to Admission medications   Medication Sig Start Date End Date Taking? Authorizing Provider  aspirin EC 81 MG tablet Take 81 mg by mouth daily. Preeclampsia ppx    Yes [provider]  Prenatal Vit-Fe Fumarate-FA (MULTIVITAMIN-PRENATAL) 27-0.8 MG TABS tablet Take 1 tablet by mouth daily at 12 noon.   Yes [provider]  buPROPion (WELLBUTRIN XL) 150 MG 24 hr tablet Take 1 tablet (150 mg total) by mouth daily. Patient not taking: Reported on 11/03/2018 08/21/17   Conard Novak, MD  hydrOXYzine (ATARAX/VISTARIL) 25 MG tablet Take 1 tablet (25 mg total) by mouth every 6 (six) hours. Patient not taking: Reported on 11/03/2018 06/14/17   Farrel Conners, CNM    Family History Family History  Problem Relation Age of Onset  . Healthy Father   . Asthma Maternal Grandmother   . COPD Paternal Grandmother     Social History Social History   Tobacco Use  . Smoking status: Current Every Day Smoker    Packs/day: 0.50    Types: Cigarettes  . Smokeless tobacco: Never Used  Substance Use Topics  . Alcohol use: Not Currently    Frequency: Never  . Drug use: Not Currently    Types: Heroin     Allergies   Latex   Review of Systems Review of Systems   Physical Exam Triage Vital Signs ED Triage Vitals  Enc Vitals Group     BP 02/23/19 1337 (!) 147/73     Pulse Rate 02/23/19 1337 (!) 109     Resp 02/23/19 1337 16     Temp 02/23/19 1337 98.5 F (36.9 C)     Temp Source 02/23/19 1337 Oral     SpO2 02/23/19 1337 99 %     Weight 02/23/19 1335 198 lb (89.8 kg)     Height 02/23/19 1335 5\' 4"  (1.626 m)     Head Circumference --      Peak Flow --      Pain Score 02/23/19 1335 7     Pain Loc --      Pain Edu? --      Excl. in [place]? --    No data found.  Updated Vital Signs BP (!) 147/73 (BP Location: Left Arm)   Pulse (!) 109   Temp 98.5 F (36.9 C) (Oral)   Resp 16   Ht 5\' 4"  (1.626 m)   Wt 89.8 kg   LMP 06/17/2018 (Exact Date)   SpO2 99%   BMI 33.99 kg/m   Visual Acuity Right Eye Distance:   Left Eye Distance:   Bilateral Distance:    Right Eye Near:   Left Eye Near:    Bilateral Near:     Physical Exam  Vitals signs and nursing note reviewed.  Constitutional:      General: She is not in acute distress.    Appearance: She is not toxic-appearing or diaphoretic.  HENT:     Nose: Congestion present.  Cardiovascular:     Rate and Rhythm: Normal rate.     Heart sounds: Normal heart sounds.  Pulmonary:     Effort: Pulmonary effort is normal. No respiratory distress.     Breath sounds: Normal breath sounds. No stridor. No wheezing, rhonchi or rales.  Neurological:     Mental Status: She is alert.    FHTs: 150  UC Treatments / Results  Labs (all labs ordered are listed, but only abnormal results are displayed) Labs Reviewed  NOVEL CORONAVIRUS, NAA (HOSP ORDER, SEND-OUT TO REF LAB; TAT 18-24 HRS)    EKG   Radiology No results found.  Procedures Procedures (including critical care time)  Medications Ordered in UC Medications - No data to display  Initial Impression / Assessment and Plan / UC Course  I have reviewed the triage vital signs and the nursing notes.  Pertinent labs & imaging results that were available during my care of the patient were reviewed by me and considered in my medical decision making (see chart for details).      Final Clinical Impressions(s) / UC Diagnoses   Final diagnoses:  Viral URI with cough     Discharge Instructions     Rest, fluids, over the counter medications Await test result    ED Prescriptions    None      1. diagnosis reviewed with patient 2. rx as per orders above; reviewed possible side effects, interactions, risks and benefits  3. Recommend supportive treatment as above 4. covid test done; await results  4. Follow-up prn if symptoms worsen or don't improve   PDMP not reviewed this encounter.   Norval Gable, MD 02/23/19 1436

## 2019-02-23 NOTE — Discharge Instructions (Signed)
Rest, fluids, over the counter medications °Await test result °

## 2019-02-23 NOTE — ED Triage Notes (Signed)
Patient complains of cough, congestion, body aches that started yesterday. Patient is currently 31weeks and 4 days pregnant.

## 2019-02-24 LAB — NOVEL CORONAVIRUS, NAA (HOSP ORDER, SEND-OUT TO REF LAB; TAT 18-24 HRS): SARS-CoV-2, NAA: NOT DETECTED

## 2019-02-25 ENCOUNTER — Ambulatory Visit: Payer: Medicaid Other | Admitting: Advanced Practice Midwife

## 2019-02-25 ENCOUNTER — Other Ambulatory Visit: Payer: Self-pay

## 2019-02-25 DIAGNOSIS — O99320 Drug use complicating pregnancy, unspecified trimester: Secondary | ICD-10-CM

## 2019-02-25 DIAGNOSIS — F1910 Other psychoactive substance abuse, uncomplicated: Secondary | ICD-10-CM

## 2019-02-25 DIAGNOSIS — O0990 Supervision of high risk pregnancy, unspecified, unspecified trimester: Secondary | ICD-10-CM

## 2019-02-25 DIAGNOSIS — Q270 Congenital absence and hypoplasia of umbilical artery: Secondary | ICD-10-CM

## 2019-02-25 DIAGNOSIS — Z8759 Personal history of other complications of pregnancy, childbirth and the puerperium: Secondary | ICD-10-CM

## 2019-02-25 DIAGNOSIS — O99330 Smoking (tobacco) complicating pregnancy, unspecified trimester: Secondary | ICD-10-CM

## 2019-02-25 DIAGNOSIS — F1410 Cocaine abuse, uncomplicated: Secondary | ICD-10-CM

## 2019-02-25 DIAGNOSIS — O285 Abnormal chromosomal and genetic finding on antenatal screening of mother: Secondary | ICD-10-CM

## 2019-02-25 LAB — URINALYSIS
Bilirubin, UA: NEGATIVE
Glucose, UA: NEGATIVE
Ketones, UA: NEGATIVE
Nitrite, UA: NEGATIVE
Protein,UA: NEGATIVE
RBC, UA: NEGATIVE
Specific Gravity, UA: 1.01 (ref 1.005–1.030)
Urobilinogen, Ur: 0.2 mg/dL (ref 0.2–1.0)
pH, UA: 7 (ref 5.0–7.5)

## 2019-02-25 NOTE — Progress Notes (Addendum)
   PRENATAL VISIT NOTE  Subjective:  Karen Brooks is a 28 y.o. G2P0100 at [redacted]w[redacted]d being seen today for ongoing prenatal care.  She is currently monitored for the following issues for this high-risk pregnancy and has Polysubstance abuse ([place]); Migraines; Fibrocystic breast; Cocaine abuse ([place]); Chronic hepatitis C affecting pregnancy, antepartum ([place]); History of IUFD; History of pregnancy induced hypertension; Abnormal genetic test during pregnancy; UTI in pregnancy, second trimester; Trichomonal infection; Abnormal Pap smear of cervix; Drug use affecting pregnancy, antepartum; Depression, recurrent ([place]); Tobacco use affecting pregnancy, antepartum 1/2-1 ppd; Supervision of high risk pregnancy, antepartum; and Single umbilical artery on their problem list.  Patient reports no complaints.  Contractions: Not present. Vag. Bleeding: None.  Movement: Present. Denies leaking of fluid/ROM.   The following portions of the patient's history were reviewed and updated as appropriate: allergies, current medications, past family history, past medical history, past social history, past surgical history and problem list. Problem list updated.  Objective:   Vitals:   02/25/19 0847  Temp: (!) 97.2 F (36.2 C)  Weight: 195 lb 6.4 oz (88.6 kg)    Fetal Status: Fetal Heart Rate (bpm): 140 Fundal Height: 33 cm Movement: Present     General:  Alert, oriented and cooperative. Patient is in no acute distress.  Skin: Skin is warm and dry. No rash noted.   Cardiovascular: Normal heart rate noted  Respiratory: Normal respiratory effort, no problems with respiration noted  Abdomen: Soft, gravid, appropriate for gestational age.  Pain/Pressure: Absent     Pelvic: Cervical exam deferred        Extremities: Normal range of motion.  Edema: Trace  Mental Status: Normal mood and affect. Normal behavior. Normal judgment and thought content.   Assessment and Plan:  Pregnancy: G2P0100 at [redacted]w[redacted]d  1. Single umbilical  artery To begin fetal testing at 34 wks weekly.    2. History of IUFD To begin fetal testing at 34 wks weekly Growth u/s 03/03/19  3. History of pregnancy induced hypertension U/a today with BP elevated first check today; wnl at second check; tr edema - Urinalysis (Urine Dip)  4. Abnormal genetic test during pregnancy Abnormal Quad screen with increased risk Down's--drawn too early (12 wks on 10/09/18) and [facility] prison did not send for  redraw at appropriate time  5. Drug use affecting pregnancy, antepartum Pt denies MJ past 8 months; last heroin 2018, last cocaine April - 789231 Drug Screen  6. Tobacco use affecting pregnancy, antepartum Smoking 1`/2-3/4 ppd--counseled to stop  7. Supervision of high risk pregnancy, antepartum Not working.  Living with p. Grandparents and FOB.  C/o URI with neg Covid test 02/23/19; taking Robitussin for cough.  Reminded of [facility] u/s 03/03/19 for growth   Preterm labor symptoms and general obstetric precautions including but not limited to vaginal bleeding, contractions, leaking of fluid and fetal movement were reviewed in detail with the patient. Please refer to After Visit Summary for other counseling recommendations.  Return in about 2 weeks (around 03/11/2019) for routine PNC.  No future appointments.  Herbie Saxon, CNM

## 2019-02-25 NOTE — Progress Notes (Signed)
02/23/2019 to Urgent Care in [facility] for URI symptoms. Corona virus test was negative. Female with client at visit denies any recent illness or Covid symptoms. Aware of 03/03/2019 [facility] [place] appt. Initial BP 138/83. Repeated after sitting for ~ 8 minutes and  was 115/78. Rich Number, RN

## 2019-02-26 LAB — 789231 7+OXYCODONE-BUND
Amphetamines, Urine: NEGATIVE ng/mL
BENZODIAZ UR QL: NEGATIVE ng/mL
Barbiturate screen, urine: NEGATIVE ng/mL
Cannabinoid Quant, Ur: NEGATIVE ng/mL
Cocaine (Metab.): NEGATIVE ng/mL
OPIATE SCREEN URINE: NEGATIVE ng/mL
Oxycodone/Oxymorphone, Urine: NEGATIVE ng/mL
PCP Quant, Ur: NEGATIVE ng/mL

## 2019-03-05 NOTE — Addendum Note (Signed)
Addended by: Cletis Media on: 03/05/2019 09:51 AM   Modules accepted: Orders

## 2019-03-11 ENCOUNTER — Ambulatory Visit: Payer: Medicaid Other | Admitting: Advanced Practice Midwife

## 2019-03-11 ENCOUNTER — Other Ambulatory Visit: Payer: Self-pay

## 2019-03-11 DIAGNOSIS — O0990 Supervision of high risk pregnancy, unspecified, unspecified trimester: Secondary | ICD-10-CM

## 2019-03-11 DIAGNOSIS — Q270 Congenital absence and hypoplasia of umbilical artery: Secondary | ICD-10-CM

## 2019-03-11 DIAGNOSIS — B182 Chronic viral hepatitis C: Secondary | ICD-10-CM

## 2019-03-11 DIAGNOSIS — Z8759 Personal history of other complications of pregnancy, childbirth and the puerperium: Secondary | ICD-10-CM

## 2019-03-11 DIAGNOSIS — O99330 Smoking (tobacco) complicating pregnancy, unspecified trimester: Secondary | ICD-10-CM

## 2019-03-11 DIAGNOSIS — L299 Pruritus, unspecified: Secondary | ICD-10-CM | POA: Insufficient documentation

## 2019-03-11 DIAGNOSIS — O98419 Viral hepatitis complicating pregnancy, unspecified trimester: Secondary | ICD-10-CM

## 2019-03-11 DIAGNOSIS — O99320 Drug use complicating pregnancy, unspecified trimester: Secondary | ICD-10-CM

## 2019-03-11 LAB — URINALYSIS
Bilirubin, UA: NEGATIVE
Glucose, UA: NEGATIVE
Ketones, UA: NEGATIVE
Nitrite, UA: NEGATIVE
Protein,UA: NEGATIVE
RBC, UA: NEGATIVE
Specific Gravity, UA: 1.02 (ref 1.005–1.030)
Urobilinogen, Ur: 0.2 mg/dL (ref 0.2–1.0)
pH, UA: 6 (ref 5.0–7.5)

## 2019-03-11 LAB — FETAL NONSTRESS TEST

## 2019-03-11 NOTE — Progress Notes (Signed)
Patient started on NST 9:00am, given snack and drink as patient hasn't eaten breakfast today. Patient scheduled for lab visit for tomorrow for fasting bile acids, patient educated about fasting 8 hours before test and states understanding. Patient also schedule for MH RV and NST in 1 week per provider order. [facility] U/S referral faxed, appointment pending. Repeat BP,  Manual,  by C. Clydene Laming, RN [phone], provider aware.Jenetta Downer, RN

## 2019-03-11 NOTE — Progress Notes (Signed)
Patient here for MH RV at 33 6/7. C/O itching all over body, denies rash. Needs NST today.Jenetta Downer, RN

## 2019-03-11 NOTE — Progress Notes (Signed)
NST reviewed by provider, E. Sciora, before patient taken off NST.Marland KitchenJenetta Downer, RN

## 2019-03-11 NOTE — Progress Notes (Signed)
   PRENATAL VISIT NOTE  Subjective:  Karen Brooks is a 28 y.o. G2P0100 at [redacted]w[redacted]d being seen today for ongoing prenatal care.  She is currently monitored for the following issues for this high-risk pregnancy and has Polysubstance abuse ([place]); Migraines; Fibrocystic breast; Cocaine abuse ([place]); Chronic hepatitis C affecting pregnancy, antepartum ([place]); History of IUFD 33 1/7; History of pregnancy induced hypertension; Abnormal genetic test during pregnancy; abnormal Quad screen drawn at 12 wks & never recalculated in prison; UTI in pregnancy, second trimester; Trichomonal infection; Abnormal Pap smear of cervix; Drug use affecting pregnancy, antepartum; Depression, recurrent ([place]); Tobacco use affecting pregnancy, antepartum 1/2-1 ppd; Supervision of high risk pregnancy, antepartum; and Single umbilical artery on their problem list.  Patient reports itching onset 1 month ago began on abdomen, arms, legs, chest worsening.  Contractions: Not present. Vag. Bleeding: None.  Movement: Present. Denies leaking of fluid/ROM.   The following portions of the patient's history were reviewed and updated as appropriate: allergies, current medications, past family history, past medical history, past social history, past surgical history and problem list. Problem list updated.  Objective:   Vitals:   03/11/19 0814  BP: 133/77  Temp: (!) 97 F (36.1 C)  Weight: 197 lb (89.4 kg)    Fetal Status: Fetal Heart Rate (bpm): 140 Fundal Height: 34 cm Movement: Present     General:  Alert, oriented and cooperative. Patient is in no acute distress.  Skin: Skin is warm and dry. No rash noted.   Cardiovascular: Normal heart rate noted  Respiratory: Normal respiratory effort, no problems with respiration noted  Abdomen: Soft, gravid, appropriate for gestational age.  Pain/Pressure: Absent     Pelvic: Cervical exam deferred        Extremities: Normal range of motion.  Edema: Trace  Mental Status: Normal mood and  affect. Normal behavior. Normal judgment and thought content.   Assessment and Plan:  Pregnancy: G2P0100 at [redacted]w[redacted]d  1. Single umbilical artery Reviewed 11/10 20 growth u/s; see note below  2. Chronic hepatitis C affecting pregnancy, antepartum (HCC) C/o itchy body onset 1 month ago  3. History of IUFD Needs NST weekly  4. History of pregnancy induced hypertension Repeat BP 132/74 today; 133/77. C/o daily frontal h/a relieved with sleep or eating, denies scotoma, sl edema. Elevated BP with last pregnancy and birth 170/112 per pt - Urinalysis (Urine Dip)  5. Drug use affecting pregnancy, antepartum Pt denies drug use since 06/2018  6. Tobacco use affecting pregnancy, antepartum Smoking <-1/2 ppd  7. Supervision of high risk pregnancy, antepartum C/o itchy body onset 1 month ago abdomen, arms, legs, chest, back.  No rash. States no change in body soap, detergent, lotion.  To come in tomorrow for fasting bile acids.  Suggestions given Reviewed 03/03/19 growth u/s with adequate growth, AFI=wnl, EFW=51%, BPP 8/8.  To schedule f/u growth u/s in 4 wks NST reactive today Living with MGM, MGF, FOB Monitor BP, UDS next appt   Preterm labor symptoms and general obstetric precautions including but not limited to vaginal bleeding, contractions, leaking of fluid and fetal movement were reviewed in detail with the patient. Please refer to After Visit Summary for other counseling recommendations.  Return in about 1 week (around 03/18/2019) for NST + RV.  Future Appointments  Date Time Provider [place]  03/18/2019  9:00 AM [facility] PROVIDER [facility] None    Herbie Saxon, CNM

## 2019-03-12 ENCOUNTER — Other Ambulatory Visit: Payer: Medicaid Other

## 2019-03-12 DIAGNOSIS — L299 Pruritus, unspecified: Secondary | ICD-10-CM

## 2019-03-12 DIAGNOSIS — O99713 Diseases of the skin and subcutaneous tissue complicating pregnancy, third trimester: Secondary | ICD-10-CM | POA: Diagnosis not present

## 2019-03-12 NOTE — Progress Notes (Signed)
Presents for fasting bile acids. Per client, last oral intake was at 8:00 pm last night. Client sent to lab (accompanied by female partner) and aware [facility] will notify her of results when available. Rich Number, RN

## 2019-03-14 LAB — BILE ACIDS, TOTAL: Bile Acids Total: 3.1 umol/L (ref 0.0–10.0)

## 2019-03-16 ENCOUNTER — Telehealth: Payer: Self-pay | Admitting: General Practice

## 2019-03-16 NOTE — Telephone Encounter (Signed)
RECIEVED TR AND HAS QUESTIONS

## 2019-03-17 ENCOUNTER — Telehealth: Payer: Self-pay

## 2019-03-17 NOTE — Telephone Encounter (Signed)
Phone call to client and states someone talked to her yesterday and currently does not have any questions about her lab results. Rich Number, RN

## 2019-03-17 NOTE — Telephone Encounter (Signed)
03/11/2019 [facility] [place] referral for growth scan ~ 03/31/2019 faxed and clinic has subsequently received fax from [facility] indicating unsuccessful attempts to contact client to schedule appt. Phone call to client with above information and given [facility] OB US scheduler number so she can call to schedule appt. Encouraged client to call for appt today. Client aware of [facility] RV appt with NST tomorrow. Rich Number, RN

## 2019-03-18 ENCOUNTER — Ambulatory Visit: Payer: Medicaid Other | Admitting: Family Medicine

## 2019-03-18 ENCOUNTER — Other Ambulatory Visit: Payer: Self-pay

## 2019-03-18 ENCOUNTER — Encounter: Payer: Self-pay | Admitting: Family Medicine

## 2019-03-18 VITALS — BP 140/67 | Temp 98.4°F | Wt 198.0 lb

## 2019-03-18 DIAGNOSIS — L299 Pruritus, unspecified: Secondary | ICD-10-CM

## 2019-03-18 DIAGNOSIS — Q270 Congenital absence and hypoplasia of umbilical artery: Secondary | ICD-10-CM

## 2019-03-18 DIAGNOSIS — O0990 Supervision of high risk pregnancy, unspecified, unspecified trimester: Secondary | ICD-10-CM

## 2019-03-18 DIAGNOSIS — O99320 Drug use complicating pregnancy, unspecified trimester: Secondary | ICD-10-CM

## 2019-03-18 DIAGNOSIS — O99330 Smoking (tobacco) complicating pregnancy, unspecified trimester: Secondary | ICD-10-CM

## 2019-03-18 DIAGNOSIS — O98419 Viral hepatitis complicating pregnancy, unspecified trimester: Secondary | ICD-10-CM

## 2019-03-18 DIAGNOSIS — Z8759 Personal history of other complications of pregnancy, childbirth and the puerperium: Secondary | ICD-10-CM

## 2019-03-18 DIAGNOSIS — B182 Chronic viral hepatitis C: Secondary | ICD-10-CM

## 2019-03-18 DIAGNOSIS — O163 Unspecified maternal hypertension, third trimester: Secondary | ICD-10-CM

## 2019-03-18 LAB — URINALYSIS
Bilirubin, UA: NEGATIVE
Glucose, UA: NEGATIVE
Ketones, UA: NEGATIVE
Nitrite, UA: NEGATIVE
Protein,UA: NEGATIVE
Specific Gravity, UA: 1.015 (ref 1.005–1.030)
Urobilinogen, Ur: 0.2 mg/dL (ref 0.2–1.0)
pH, UA: 6 (ref 5.0–7.5)

## 2019-03-18 NOTE — Progress Notes (Signed)
In for visit; reports taking ASA & PNV; was @ dentist yesterday and due to BP 140/90 @ office, procedure not carried out; denies hospital visits; reports having contractions all night-unsure how many in an hour-reviewed s/s PTL Debera Lat, RN

## 2019-03-18 NOTE — Progress Notes (Signed)
PRENATAL VISIT NOTE  Subjective:  Karen Brooks is a 28 y.o. G2P0100 at [redacted]w[redacted]d being seen today for ongoing prenatal care.  She is currently monitored for the following issues for this high-risk pregnancy and has Polysubstance abuse (HCC); Migraines; Fibrocystic breast; Cocaine abuse (HCC); Chronic hepatitis C affecting pregnancy, antepartum (HCC); History of IUFD 33 1/7; History of pregnancy induced hypertension; Abnormal genetic test during pregnancy; abnormal Quad screen drawn at 12 wks & never recalculated in prison; UTI in pregnancy, second trimester; Trichomonal infection; Abnormal Pap smear of cervix; Drug use affecting pregnancy, antepartum; Depression, recurrent (HCC); Tobacco use affecting pregnancy, antepartum 1/2-1 ppd; Supervision of high risk pregnancy, antepartum; Single umbilical artery; and Itching on their problem list.  Patient reports contractions. Started at 5:30pm last night, describes as pressure in abdomen, vagina, back. Also had a headache. She was able to sleep from 10p-2a, 3a-7p, continued to have tightening upon waking. She didn't time contractions, but they lasted "a couple minutes" then stopped for "a couple minutes" then repeated. Was unable to eat. Same tightening/contractions continue intermittently now. She has had a brownie and some milk to drink this morning.    Contractions: Irregular. Vag. Bleeding: None.  Movement: Present. Denies leaking of fluid/ROM.   BP elevated today, states it was 140/90 at dentist yesterday, therefore they didn't fill cavities/perform root canal. States swelling of hands has gotten worse, painful to bend fingers in the mornings. Feet continue swelling as prior. She endorses "peeing a lot, more than usual" though can't specify if this is a large quantity or frequent visits to restroom.   She continues to take aspirin. Smokes ~6-7 cig/day. Denies recent drug use, including marijuana and cocaine (since March 2020) and heroine (since  2018).  She continues to have itching of entire body, worse in feel and palms. States it is constant. She has switched to unscented lotion following last visit.      The following portions of the patient's history were reviewed and updated as appropriate: allergies, current medications, past family history, past medical history, past social history, past surgical history and problem list. Problem list updated.  Objective:   Vitals:   03/18/19 0923  BP: 140/67  Temp: 98.4 F (36.9 C)  Weight: 198 lb (89.8 kg)    Fetal Status: Fetal Heart Rate (bpm): 150 Fundal Height: 35 cm Movement: Present     General:  Alert, oriented and cooperative. Patient is in no acute distress.  Skin: Skin is warm and dry. No rash noted on entire body, including hands and feet.   Cardiovascular: Normal heart rate noted  Respiratory: Normal respiratory effort, no problems with respiration noted  Abdomen: Gravid, appropriate for gestational age. Mild contraction noted, 48 seconds, 3-30 minutes apart, irregular Pain/Pressure: Present     Pelvic: Cervical exam performed Dilation: 1 Effacement (%): 70    Extremities: Normal range of motion.  Edema: Moderate pitting, indentation subsides rapidly  Mental Status: Normal mood and affect. Normal behavior. Normal judgment and thought content.   Assessment and Plan:  Pregnancy: G2P0100 at [redacted]w[redacted]d   1. Supervision of high risk pregnancy/Suspected preterm labor D/t contractions onset yesterday at 5:30pm and history of IUFD at 33 wks pt sent to [facility] L&D for evaluation and monitoring (weekly NST due today) now. Urine with trace blood, c/s sent. Report was given to [facility] L&D nurse Eileen Stanford as well as Ob Family Medicine resident [place]. - Urinalysis (Urine Dip) - Urine Culture & Sensitivity - 564332 Drug Screen - Protein / creatinine  ratio, urine  (Spot)  2. Elevated blood pressure affecting pregnancy in third trimester, antepartum Has been increasing, systolic 803 on two  readings now. 2+ pitting edema pretibial. Urine dip today w/out protein. Hx of elevated bp with last pregnancy (155/102 on admission to [facility] L&D on 07/04/17).  3. History of IUFD At [redacted]w[redacted]d  in 07/04/2017  4. Itching Continues, fasting bile acids wnl last visit. Discuss further at next visit.   5. Chronic hepatitis C affecting pregnancy, antepartum ([place])  6. Drug use affecting pregnancy, antepartum Pt continues to deny drug use since March, agrees to UDS today.   7. Tobacco use affecting pregnancy, antepartum Continues 6-7 cpd  8. Single umbilical artery   Preterm labor symptoms and general obstetric precautions including but not limited to vaginal bleeding, contractions, leaking of fluid and fetal movement were reviewed in detail with the patient. Please refer to After Visit Summary for other counseling recommendations.   Roxy Horseman, CNM and I examined and discussed this patient together.   Return in about 1 week (around 03/25/2019) for revisit, non stress test.  Future Appointments  Date Time Provider [place]  03/25/2019 10:40 AM AC-MH PROVIDER AC-MAT None    Kandee Keen, PA-C

## 2019-03-19 LAB — 789231 7+OXYCODONE-BUND
Amphetamines, Urine: NEGATIVE ng/mL
BENZODIAZ UR QL: NEGATIVE ng/mL
Barbiturate screen, urine: NEGATIVE ng/mL
Cannabinoid Quant, Ur: NEGATIVE ng/mL
Cocaine (Metab.): NEGATIVE ng/mL
OPIATE SCREEN URINE: NEGATIVE ng/mL
Oxycodone/Oxymorphone, Urine: NEGATIVE ng/mL
PCP Quant, Ur: NEGATIVE ng/mL

## 2019-03-19 LAB — PROTEIN / CREATININE RATIO, URINE
Creatinine, Urine: 72.5 mg/dL
Protein, Ur: 18 mg/dL
Protein/Creat Ratio: 248 mg/g creat — ABNORMAL HIGH (ref 0–200)

## 2019-03-20 LAB — URINE CULTURE

## 2019-03-24 ENCOUNTER — Encounter: Payer: Self-pay | Admitting: Family Medicine

## 2019-03-24 DIAGNOSIS — O139 Gestational [pregnancy-induced] hypertension without significant proteinuria, unspecified trimester: Secondary | ICD-10-CM | POA: Insufficient documentation

## 2019-03-25 ENCOUNTER — Ambulatory Visit: Payer: Medicaid Other | Admitting: Advanced Practice Midwife

## 2019-03-25 ENCOUNTER — Other Ambulatory Visit: Payer: Self-pay

## 2019-03-25 DIAGNOSIS — O98419 Viral hepatitis complicating pregnancy, unspecified trimester: Secondary | ICD-10-CM

## 2019-03-25 DIAGNOSIS — O2342 Unspecified infection of urinary tract in pregnancy, second trimester: Secondary | ICD-10-CM

## 2019-03-25 DIAGNOSIS — O0990 Supervision of high risk pregnancy, unspecified, unspecified trimester: Secondary | ICD-10-CM

## 2019-03-25 DIAGNOSIS — Q270 Congenital absence and hypoplasia of umbilical artery: Secondary | ICD-10-CM | POA: Diagnosis not present

## 2019-03-25 DIAGNOSIS — Z8759 Personal history of other complications of pregnancy, childbirth and the puerperium: Secondary | ICD-10-CM

## 2019-03-25 DIAGNOSIS — B182 Chronic viral hepatitis C: Secondary | ICD-10-CM

## 2019-03-25 DIAGNOSIS — O285 Abnormal chromosomal and genetic finding on antenatal screening of mother: Secondary | ICD-10-CM

## 2019-03-25 DIAGNOSIS — O99320 Drug use complicating pregnancy, unspecified trimester: Secondary | ICD-10-CM

## 2019-03-25 DIAGNOSIS — O99330 Smoking (tobacco) complicating pregnancy, unspecified trimester: Secondary | ICD-10-CM

## 2019-03-25 LAB — FETAL NONSTRESS TEST

## 2019-03-25 LAB — URINALYSIS
Bilirubin, UA: NEGATIVE
Glucose, UA: NEGATIVE
Ketones, UA: NEGATIVE
Leukocytes,UA: NEGATIVE
Nitrite, UA: NEGATIVE
Protein,UA: NEGATIVE
RBC, UA: NEGATIVE
Specific Gravity, UA: 1.01 (ref 1.005–1.030)
Urobilinogen, Ur: 0.2 mg/dL (ref 0.2–1.0)
pH, UA: 6 (ref 5.0–7.5)

## 2019-03-25 NOTE — Progress Notes (Signed)
   PRENATAL VISIT NOTE  Subjective:  Karen Brooks is a 28 y.o. G2P0100 at [redacted]w[redacted]d being seen today for ongoing prenatal care.  She is currently monitored for the following issues for this high-risk pregnancy and has Polysubstance abuse ([place]); Migraines; Fibrocystic breast; Cocaine abuse ([place]); Chronic hepatitis C affecting pregnancy, antepartum ([place]); History of IUFD 33 1/7; History of pregnancy induced hypertension; Abnormal genetic test during pregnancy; abnormal Quad screen drawn at 12 wks & never recalculated in prison; UTI in pregnancy, second trimester; Trichomonal infection; Abnormal Pap smear of cervix; Drug use affecting pregnancy, antepartum; Depression, recurrent ([place]); Tobacco use affecting pregnancy, antepartum 1/2-1 ppd; Supervision of high risk pregnancy, antepartum; Single umbilical artery; Itching; and Gestational hypertension on their problem list.  Patient reports no complaints.  Contractions: Irritability. Vag. Bleeding: None.  Movement: Present. Denies leaking of fluid/ROM.   The following portions of the patient's history were reviewed and updated as appropriate: allergies, current medications, past family history, past medical history, past social history, past surgical history and problem list. Problem list updated.  Objective:   Vitals:   03/25/19 1118  BP: 127/81  Temp: (!) 97.2 F (36.2 C)  Weight: 198 lb (89.8 kg)    Fetal Status: Fetal Heart Rate (bpm): 140 Fundal Height: 37 cm Movement: Present     General:  Alert, oriented and cooperative. Patient is in no acute distress.  Skin: Skin is warm and dry. No rash noted.   Cardiovascular: Normal heart rate noted  Respiratory: Normal respiratory effort, no problems with respiration noted  Abdomen: Soft, gravid, appropriate for gestational age.  Pain/Pressure: Absent     Pelvic: Cervical exam deferred        Extremities: Normal range of motion.  Edema: None  Mental Status: Normal mood and affect. Normal  behavior. Normal judgment and thought content.   Assessment and Plan:  Pregnancy: G2P0100 at [redacted]w[redacted]d  1. Single umbilical artery NST weekly  2. Chronic hepatitis C affecting pregnancy, antepartum ([place])   3. History of IUFD NST weekly  4. History of pregnancy induced hypertension BP 127/81 today.  No edema  5.. Drug use affecting pregnancy, antepartum States no use since 06/2018.  Agrees to UDS today - 606301 Drug Screen  6. Tobacco use affecting pregnancy, antepartum Smoking 1/2-1 ppd--counseled via 5 A's to stop 7.. Supervision of high risk pregnancy, antepartum Feels better.  [place] continues.  Here with FOB.  IOL scheduled 37 wks 04/02/19.  Pt reminded of [facility] u/s 03/30/19 NST today (reactive) and weekly - Urinalysis (Urine Dip)   Preterm labor symptoms and general obstetric precautions including but not limited to vaginal bleeding, contractions, leaking of fluid and fetal movement were reviewed in detail with the patient. Please refer to After Visit Summary for other counseling recommendations.  Return in about 1 week (around 04/01/2019).  Future Appointments  Date Time Provider [place]  04/01/2019  9:00 AM AC-MH PROVIDER AC-MAT None    Herbie Saxon, CNM

## 2019-03-25 NOTE — Progress Notes (Signed)
NST today, patient given crackers to eat, brought own drink. NST started 12:05pm and reviewed by provider, E. Sciora, before ending NST. Patient scheduled to return for NST and RV in one week, reminder card given.Jenetta Downer, RN

## 2019-03-25 NOTE — Progress Notes (Signed)
Patient here for MH RV at 35 6/7. Patient had [facility] visit last week, 36 week labs done on 03/18/2019 at [facility]. 36 week packet given. [facility] IOL scheduled for 04/02/2019. [facility] U/S 03/30/2019, patient aware.Jenetta Downer, RN

## 2019-03-26 LAB — 789231 7+OXYCODONE-BUND
Amphetamines, Urine: NEGATIVE ng/mL
BENZODIAZ UR QL: NEGATIVE ng/mL
Barbiturate screen, urine: NEGATIVE ng/mL
Cannabinoid Quant, Ur: NEGATIVE ng/mL
Cocaine (Metab.): NEGATIVE ng/mL
OPIATE SCREEN URINE: NEGATIVE ng/mL
Oxycodone/Oxymorphone, Urine: NEGATIVE ng/mL
PCP Quant, Ur: NEGATIVE ng/mL

## 2019-04-01 ENCOUNTER — Encounter: Payer: Self-pay | Admitting: Family Medicine

## 2019-04-01 ENCOUNTER — Other Ambulatory Visit: Payer: Self-pay

## 2019-04-01 ENCOUNTER — Ambulatory Visit: Payer: Medicaid Other | Admitting: Family Medicine

## 2019-04-01 VITALS — BP 124/85 | Temp 97.1°F | Wt 200.6 lb

## 2019-04-01 DIAGNOSIS — O285 Abnormal chromosomal and genetic finding on antenatal screening of mother: Secondary | ICD-10-CM

## 2019-04-01 DIAGNOSIS — Z8759 Personal history of other complications of pregnancy, childbirth and the puerperium: Secondary | ICD-10-CM

## 2019-04-01 DIAGNOSIS — O99330 Smoking (tobacco) complicating pregnancy, unspecified trimester: Secondary | ICD-10-CM

## 2019-04-01 DIAGNOSIS — O139 Gestational [pregnancy-induced] hypertension without significant proteinuria, unspecified trimester: Secondary | ICD-10-CM

## 2019-04-01 DIAGNOSIS — B182 Chronic viral hepatitis C: Secondary | ICD-10-CM

## 2019-04-01 DIAGNOSIS — O98419 Viral hepatitis complicating pregnancy, unspecified trimester: Secondary | ICD-10-CM

## 2019-04-01 DIAGNOSIS — O99320 Drug use complicating pregnancy, unspecified trimester: Secondary | ICD-10-CM

## 2019-04-01 DIAGNOSIS — O0990 Supervision of high risk pregnancy, unspecified, unspecified trimester: Secondary | ICD-10-CM

## 2019-04-01 DIAGNOSIS — Q270 Congenital absence and hypoplasia of umbilical artery: Secondary | ICD-10-CM

## 2019-04-01 LAB — FETAL NONSTRESS TEST

## 2019-04-01 LAB — URINALYSIS
Bilirubin, UA: NEGATIVE
Glucose, UA: NEGATIVE
Ketones, UA: NEGATIVE
Leukocytes,UA: NEGATIVE
Nitrite, UA: NEGATIVE
Protein,UA: NEGATIVE
RBC, UA: NEGATIVE
Specific Gravity, UA: 1.025 (ref 1.005–1.030)
Urobilinogen, Ur: 0.2 mg/dL (ref 0.2–1.0)
pH, UA: 6 (ref 5.0–7.5)

## 2019-04-01 NOTE — Progress Notes (Signed)
Here today for 36.6 week MH RV. Taking PNV QD. Stopped ASA "last week." Negative Pre Covid result. IOL scheduled for 04/02/2019 @ [facility]. Hal Morales, RN

## 2019-04-01 NOTE — Progress Notes (Signed)
PRENATAL VISIT NOTE  Subjective:  Karen Brooks is a 28 y.o. G2P0100 at [redacted]w[redacted]d being seen today for ongoing prenatal care.  She is currently monitored for the following issues for this high-risk pregnancy and has Polysubstance abuse (HCC); Migraines; Fibrocystic breast; Cocaine abuse (HCC); Chronic hepatitis C affecting pregnancy, antepartum (HCC); History of IUFD 33 1/7; History of pregnancy induced hypertension; Abnormal genetic test during pregnancy; abnormal Quad screen drawn at 12 wks & never recalculated in prison; UTI in pregnancy, second trimester; Trichomonal infection; Abnormal Pap smear of cervix; Drug use affecting pregnancy, antepartum; Depression, recurrent (HCC); Tobacco use affecting pregnancy, antepartum 1/2-1 ppd; Supervision of high risk pregnancy, antepartum; Single umbilical artery; Itching; and Gestational hypertension on their problem list.  Patient reports no complaints.  Contractions: Irritability. Vag. Bleeding: None.  Movement: Present. Denies leaking of fluid/ROM.   Upon questioning continues to endorse hand swelling, same as prior and not worsening. Also continues whole body itching same as before, not worsening.  The following portions of the patient's history were reviewed and updated as appropriate: allergies, current medications, past family history, past medical history, past social history, past surgical history and problem list. Problem list updated.  Objective:   Vitals:   04/01/19 0914  BP: 124/85  Temp: (!) 97.1 F (36.2 C)  Weight: 200 lb 9.6 oz (91 kg)    Fetal Status: Fetal Heart Rate (bpm): 140 Fundal Height: 38 cm Movement: Present  Presentation: Vertex  General:  Alert, oriented and cooperative. Patient is in no acute distress.  Skin: Skin is warm and dry. No rash noted.   Cardiovascular: Normal heart rate noted  Respiratory: Normal respiratory effort, no problems with respiration noted  Abdomen: Soft, gravid, appropriate for gestational  age.  Pain/Pressure: Absent     Pelvic: Cervical exam deferred        Extremities: Normal range of motion.  Edema: None  Mental Status: Normal mood and affect. Normal behavior. Normal judgment and thought content.   Assessment and Plan:  Pregnancy: G2P0100 at [redacted]w[redacted]d  1. Supervision of high risk pregnancy, antepartum -IOL tomorrow. She is excited, ready for baby. Carseat in car. -She has stopped asa -NST today reactive, FHR 145, normal variability, accelerations present and frequent, decelerations absent, no contractions. -Itching persists but not worsening. Fasting bile acids negative on 11/19.   2. Tobacco use affecting pregnancy, antepartum -Continues smoking 1/2 ppd. We discussed risks of second hand smoke to newborn. Counseled using 5 A's, her goal is to wean off cig by herself over next few months.  3. Drug use affecting pregnancy, antepartum Last use was prior to incarceration (entered jail 08/13/2018). Recent UDS on 12/2 was neg.  4. Gestational hypertension, antepartum Dx based off two systolic values of 140 on 11/24 and 11/25. No further elevated readings. Urine without protein. - Urinalysis (Urine Dip)  5. Single umbilical artery Weekly NST today: reactive, FHR 145, normal variability, accelerations present and frequent, decelerations absent, no contractions.  6. Chronic hepatitis C affecting pregnancy, antepartum (HCC) Repeat LFTs today - Hepatic function panel  7. History of IUFD U/s 12/7 at [facility] wnl (aside from [facility] >99%), EFW 79%  8. Abnormal genetic test during pregnancy Quad screen elevated 1:139 for Jeral Pinch but done too early, never redrawn d/t pt in prison.   Preterm labor symptoms and general obstetric precautions including but not limited to vaginal bleeding, contractions, leaking of fluid and fetal movement were reviewed in detail with the patient. Please refer to After Visit Summary for other counseling  recommendations.  Return in about 6 weeks (around  05/13/2019) for postpartum visit.  No future appointments.  Kandee Keen, PA-C

## 2019-04-02 LAB — HEPATIC FUNCTION PANEL
ALT: 21 IU/L (ref 0–32)
AST: 20 IU/L (ref 0–40)
Albumin: 3.3 g/dL — ABNORMAL LOW (ref 3.9–5.0)
Alkaline Phosphatase: 171 IU/L — ABNORMAL HIGH (ref 39–117)
Bilirubin Total: 0.4 mg/dL (ref 0.0–1.2)
Bilirubin, Direct: 0.15 mg/dL (ref 0.00–0.40)
Total Protein: 5.8 g/dL — ABNORMAL LOW (ref 6.0–8.5)

## 2019-04-04 DIAGNOSIS — Z3483 Encounter for supervision of other normal pregnancy, third trimester: Secondary | ICD-10-CM | POA: Diagnosis not present

## 2019-04-05 NOTE — Discharge Summary (Signed)
 ------------------------------------------------------------------------------- Attestation signed by Berkley Cheryle Fairy Thaddeus, MD at 04/08/19 0825 I saw and evaluated the patient, participating in the key portions of the service on the day of discharge.  I reviewed the resident's note and agree with the discharge plans and disposition. I personally spent >30 minutes in discharge planning services. Cheryle JINNY Berkley, MD -------------------------------------------------------------------------------  FAMILY MEDICINE POSTPARTUM DISCHARGE SUMMARY   Admit Date: 04/02/2019  Date of Delivery: 04/04/2019   Discharge Date: 04/06/2019  Discharge to: Home  Discharge Attending Physician: Cheryle Fairy Thaddeus Berkley,*  Delivery Type: normal spontaneous vaginal delivery  EDD: 04/23/2019, by Patient Reported  Gestational Age at Delivery: Gestational Age: [redacted]w[redacted]d   Apgar Scores: 7   at 1 minute and 9   at 5 minutes  Birth Weight: 2810 g (6 lb 3.1 oz)   Prenatal Provider: [facility]  Prenatal Course/Complications: Patient Active Problem List  Diagnosis  . Single umbilical artery  . Chronic hepatitis C affecting pregnancy, antepartum (CMS-HCC)  . Depression, recurrent (CMS-HCC)  . History of IUFD  . Supervision of high risk pregnancy, antepartum  . Cocaine use  . Drug use affecting pregnancy, antepartum  . Polysubstance abuse (CMS-HCC)  . Tobacco use affecting pregnancy, antepartum  . Trichomonal infection  . UTI in pregnancy, second trimester  . Fibrocystic breast  . Gestational hypertension  . History of pregnancy induced hypertension  . Itching    Delivery Clinician: BOGGIANO, VICTORIA L   Delivery Anesthesia: IV Analgesics;Epidural   Labor Complications: Prolonged second stage  Delivery Complications: Terminal meconium  Lacerations: 2nd degree perineal  Other Procedures:   None  Hospital Course: Karen Brooks is a 28yo now G2P1101 who presented for  induction 2/2 gHTN. Pregnancy was also complicated by a single uterine artery, hx polysubstance use (none during pregnancy), trich in pregnancy, UTI in preg w/ neg TOC, incarceration during pregnancy, fetal demise at 33 wks 2019, migraines, hep C, abnl quad screen, and 1/2 ppd tobacco use. She went on to have a NSVD on 04/04/2019. Labor and delivery were complicated by a prolonged second stage. In the postpartum period, her blood pressures were normal to mild range. She had an uncomplicated postpartum course. On day of discharge, mom was tolerating po, ambulating, voiding, had minimal pain, and minimal lochia and felt ready for discharge home. She was connected to SW due to her complex social situation and provided resources. She plans to follow up with the [facility] for blood pressure check in 7-10 days, in [place] for Hep C treatment, and for a postpartum 6 week visit with [facility] vs OB here at [facility].  Postpartum Complications: None  Birth Control Planned at Discharge: Hormonal:  Oral contraceptives (estrogen/progesterone):  Infant Feeding Method at Discharge: formula  Discharge Day Services: Patient seen and examined by attending and resident physician on day of discharge.  No acute events. No complaints. Blood pressure 140/78, pulse 82, temperature 36.5 C, temperature source Oral, resp. rate 18, last menstrual period 06/17/2018, SpO2 97 %, not currently breastfeeding. GEN: AAOx3, pleasant, normal affect ENT: Moist mucous membranes of the oral cavity, neck without masses.   CV: Pulse normal rate, regular rhythm. S1 and S2 normal, without any murmur, rub, or gallop.  Lungs : Clear to auscultation bilaterally, no retractions. Good air movement. Skin: no rashes or jaundice, acyanotic Abdomen: soft, non-tender and not distended, uterus firm below umbilicus Ext: No edema or erythema, good pedal pulses  Condition at Discharge: stable  Discharge Medications:   Medication List  START taking these  medications   . acetaminophen  325 MG tablet; Commonly known as: TYLENOL ; Take 2 tablets  (650 mg total) by mouth every six (6) hours as needed. . docusate sodium  100 MG capsule; Commonly known as: COLACE; Take 1  capsule (100 mg total) by mouth two (2) times a day as needed for  constipation. . ibuprofen  600 MG tablet; Commonly known as: MOTRIN ; Take 1 tablet (600  mg total) by mouth Every six (6) hours. . multivitamin, prenatal (folic acid -iron) 27-1 mg Tab; Take 1 tablet by  mouth daily.; Start taking on: April 07, 2019 . norgestimate-ethinyl estradioL 0.25-35 mg-mcg per tablet; Commonly known  as: ORTHO-CYCLEN; Take 1 tablet by mouth daily.; Start taking on: May 04, 2019   STOP taking these medications   . aspirin 81 MG tablet; Commonly known as: ECOTRIN    Counseling:  Vaginal bleeding, abdominal pain, fevers, breastfeeding, mastitis, Postpartum depression.  More than 30 minutes spent in discharge counseling.   Follow-up: Lactation consult as needed PCP: [facility] Karen Brooks [place] [phone]  Appointments which have been scheduled for you   May 13, 2019  9:40 AM (Arrive by 9:10 AM) POSTPARTUM with Karen Fountain, MD [facility]) [address] [place] [address] [phone]        For any concerns or questions, please contact the on-call [facility] Service resident at [phone] (pager) or [phone] (phone)

## 2019-04-07 MED ORDER — NICOTINE 14 MG/24HR TD PT24
1.00 | MEDICATED_PATCH | TRANSDERMAL | Status: DC
Start: 2019-04-07 — End: 2019-04-07

## 2019-04-07 MED ORDER — DIPHENHYDRAMINE HCL 25 MG PO CAPS
25.00 | ORAL_CAPSULE | ORAL | Status: DC
Start: ? — End: 2019-04-07

## 2019-04-07 MED ORDER — IBUPROFEN 600 MG PO TABS
600.00 | ORAL_TABLET | ORAL | Status: DC
Start: 2019-04-06 — End: 2019-04-07

## 2019-04-07 MED ORDER — ONDANSETRON 4 MG PO TBDP
4.00 | ORAL_TABLET | ORAL | Status: DC
Start: ? — End: 2019-04-07

## 2019-04-07 MED ORDER — ACETAMINOPHEN 325 MG PO TABS
650.00 | ORAL_TABLET | ORAL | Status: DC
Start: ? — End: 2019-04-07

## 2019-04-07 MED ORDER — NICOTINE POLACRILEX 2 MG MT GUM
2.00 | CHEWING_GUM | OROMUCOSAL | Status: DC
Start: ? — End: 2019-04-07

## 2019-04-07 MED ORDER — CALCIUM CARBONATE ANTACID 500 MG PO CHEW
CHEWABLE_TABLET | ORAL | Status: DC
Start: ? — End: 2019-04-07

## 2019-04-07 MED ORDER — DSS 100 MG PO CAPS
100.00 | ORAL_CAPSULE | ORAL | Status: DC
Start: 2019-04-06 — End: 2019-04-07

## 2019-04-07 MED ORDER — PNV PRENATAL PLUS MULTIVITAMIN 27-1 MG PO TABS
1.00 | ORAL_TABLET | ORAL | Status: DC
Start: 2019-04-07 — End: 2019-04-07

## 2019-04-08 ENCOUNTER — Ambulatory Visit: Payer: Medicaid Other | Admitting: Advanced Practice Midwife

## 2019-04-08 ENCOUNTER — Other Ambulatory Visit: Payer: Self-pay

## 2019-04-08 VITALS — BP 145/81 | HR 100 | Wt 195.2 lb

## 2019-04-08 DIAGNOSIS — O1495 Unspecified pre-eclampsia, complicating the puerperium: Secondary | ICD-10-CM

## 2019-04-08 LAB — URINALYSIS
Bilirubin, UA: NEGATIVE
Glucose, UA: NEGATIVE
Ketones, UA: NEGATIVE
Nitrite, UA: NEGATIVE
Specific Gravity, UA: 1.015 (ref 1.005–1.030)
Urobilinogen, Ur: 0.2 mg/dL (ref 0.2–1.0)
pH, UA: 7 (ref 5.0–7.5)

## 2019-04-08 NOTE — Progress Notes (Signed)
Repeat BP 134/82, HR 90. Patient sent to lab for urine dip, per provider orders.Jenetta Downer, RN

## 2019-04-08 NOTE — Progress Notes (Signed)
   [facility]  [place] Department  Subjective:  DOMINIQUE RESSEL is a 28 y.o. being seen today for BP check after SVD on 04/04/19  Chief Complaint  Patient presents with  . Postpartum Follow-up    BP check    HPI IOL for GHTN with SVD after 4 hours of pushing 6#3 F on 04/04/19 at 37 2/7.  Bottlefeeding.  Second degree laceration.  BP 140/78 in hospital. BP today 145/81, 134/80, tr proteinuria with 3+ blood, red-pink moderate lochia.  Had h/a yesterday and denies scotoma.  No pp sex yet.  Has help from FOB at home.  Has [facility] appt 05/13/19 in Ob/Gyn clinic  Does the patient have a current or past history of drug use? Yes   No components found for: HCV]   There are no preventive care reminders to display for this patient.  ROS  The following portions of the patient's history were reviewed and updated as appropriate: allergies, current medications, past family history, past medical history, past social history, past surgical history and problem list. Problem list updated.   See flowsheet for other program required questions.  Objective:   Vitals:   04/08/19 0917  BP: (!) 145/81  Pulse: 100  Weight: 195 lb 3.2 oz (88.5 kg)    Physical Exam    Assessment and Plan:  ELVIRA LANGSTON is a 28 y.o. female presenting to the [facility] Department for a Women's Health problem visit  1. Preeclampsia in postpartum period RTC 1 week for BP check and evaluation.  Bottlefeeding; has help at home from FOB.  BP wnl today - Urinalysis (Urine Dip)     No follow-ups on file.  Future Appointments  Date Time Provider [place]  04/15/2019 10:40 AM AC-MH PROVIDER AC-MAT None    Herbie Saxon, CNM

## 2019-04-08 NOTE — Progress Notes (Signed)
Patient here for PP BP check. C/O breast engorgement and asking about breast pump, but patient not breastfeeding due to smoking. Jenetta Downer, RN

## 2019-04-15 ENCOUNTER — Ambulatory Visit: Payer: Medicaid Other

## 2019-05-19 ENCOUNTER — Ambulatory Visit: Payer: Medicaid Other

## 2019-06-17 ENCOUNTER — Telehealth: Payer: Self-pay

## 2019-06-17 NOTE — Telephone Encounter (Signed)
Attempted to call to reschedule missed PP appt.; no answer, left voicemail message with appt.# Sharlette Dense, RN

## 2019-06-22 ENCOUNTER — Ambulatory Visit: Payer: Medicaid Other

## 2020-03-10 IMAGING — MR MR MRA HEAD W/O CM
16 of 19 series · 32 of 48 positions shown · IV contrast (multihance)
Comparison: CT head without contrast 07/11/2017

CLINICAL DATA: Left-sided weakness beginning last night. Unable to
walk. Tingling to the left side of the face.

EXAM:
MRI HEAD WITHOUT AND WITH CONTRAST
MRA HEAD WITHOUT CONTRAST
MRA NECK WITHOUT AND WITH CONTRAST
MRV HEAD WITHOUT CONTRAST
TECHNIQUE: Multiplanar, multiecho pulse sequences of the brain and surrounding
structures were obtained without intravenous contrast. Angiographic
images of the Circle of Willis were obtained using MRA technique
without intravenous contrast. Angiographic images of the neck were
obtained using MRA technique without and with intravenous contrast.
Carotid stenosis measurements (when applicable) are obtained
utilizing NASCET criteria, using the distal internal carotid
diameter as the denominator. Angiographic images of the venous
structures of the head were obtained using MRV technique without IV
contrast.
CONTRAST:  13mL MULTIHANCE GADOBENATE DIMEGLUMINE 529 MG/ML IV SOLN

[Series 2: T1 · sagittal · 5.0mm · 0.45mm/px · 2 of 26 slices shown (1 of 2)]
[im 1/26]
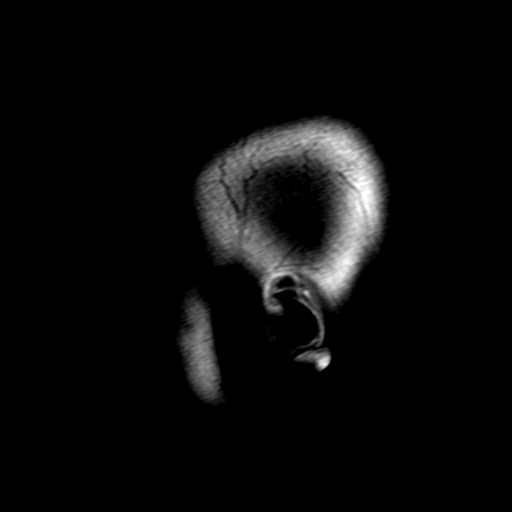
[im 26/26]
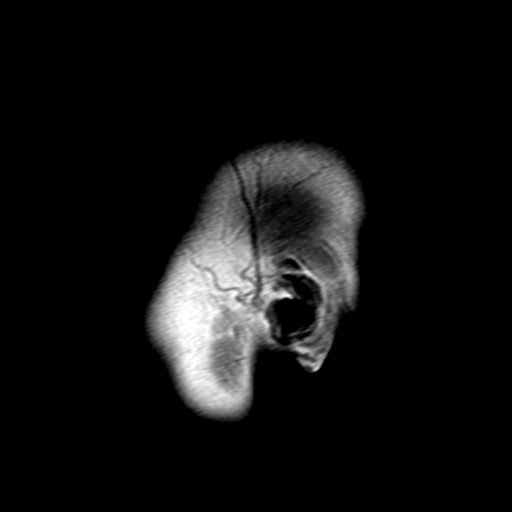

[Series 4: DWI · axial · 4.0mm · 0.94mm/px · z∈[-48,+111]mm · 2 of 41 slices shown (1 of 4)]
[im 1/41]
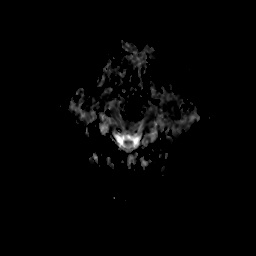
[im 41/41]
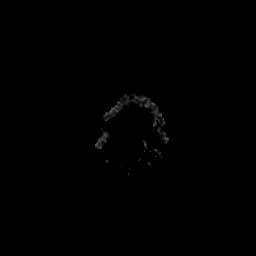

[Series 6: DWI · coronal · 5.0mm · 1.80mm/px · 2 of 35 slices shown (2 of 4)]
[im 1/35]
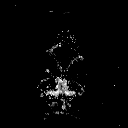
[im 35/35]
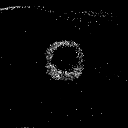

[Series 7: DWI · axial · 4.0mm · 0.94mm/px · z∈[-48,+111]mm · 2 of 41 slices shown (3 of 4)]
[im 1/41]
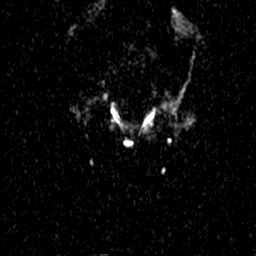
[im 41/41]
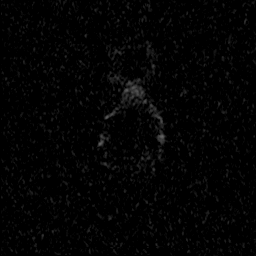

[Series 8: DWI · coronal · 5.0mm · 1.80mm/px · 2 of 35 slices shown (4 of 4)]
[im 1/35]
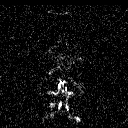
[im 35/35]
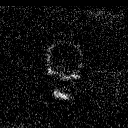

[Series 9: TOF · axial · 0.8mm · 0.39mm/px · z∈[-38,+55]mm · 6 of 118 slices shown]
[im 1/118]
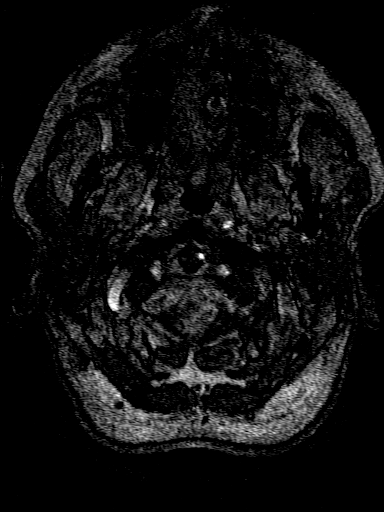
[im 24/118]
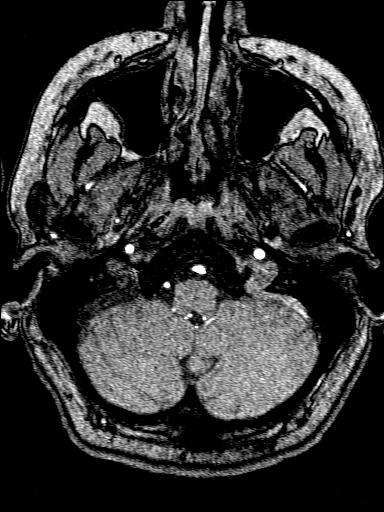
[im 47/118]
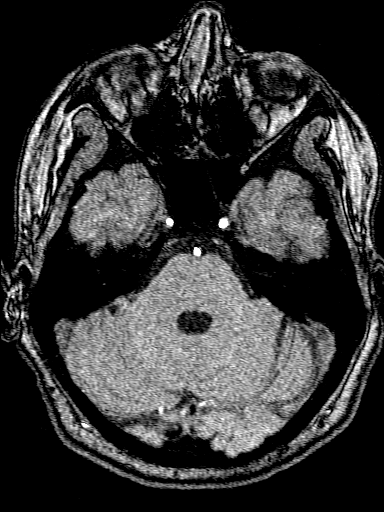
[im 71/118]
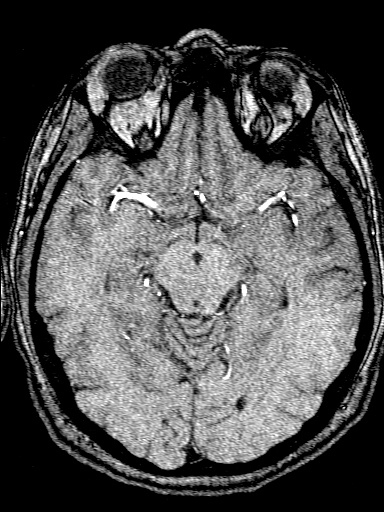
[im 94/118]
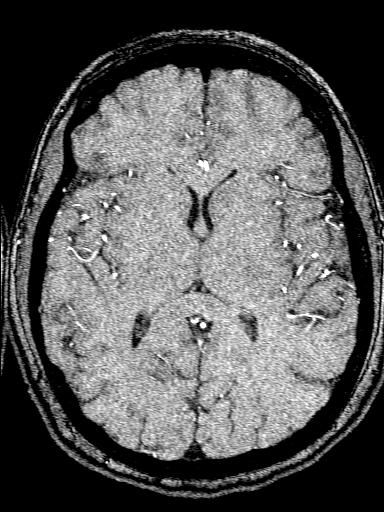
[im 118/118]
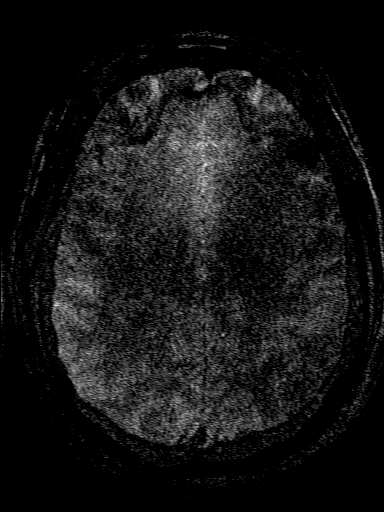

[Series 13: [id number]_cor sinus_(person_name) · coronal · 3.0mm · 0.98mm/px · 4 of 90 slices shown]
[im 1/90]
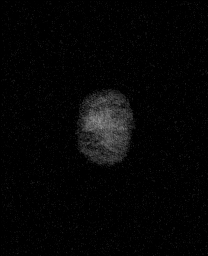
[im 30/90]
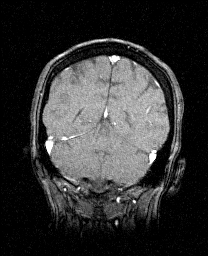
[im 60/90]
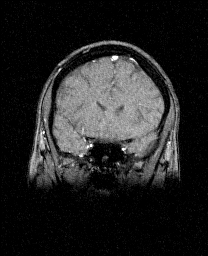
[im 90/90]
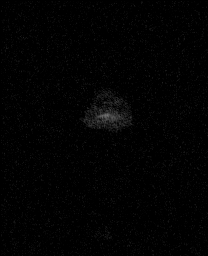

[Series 16: ant s to · axial · 0.39mm/px · 1 of 1 slices shown]
[im 1/1]
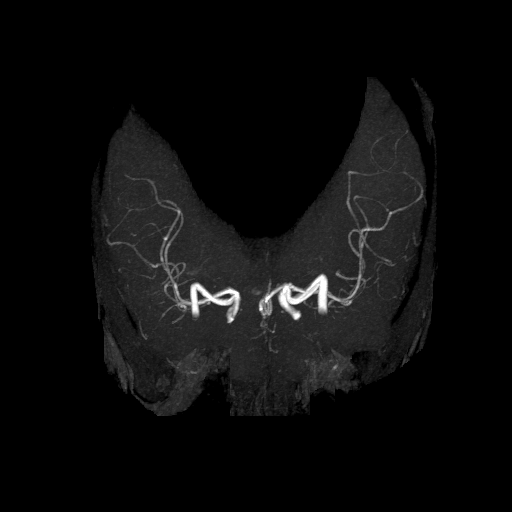

[Series 21: T2 · axial · 5.0mm · 0.45mm/px · 1 of 27 slices shown (1 of 3)]
[im 1/27]
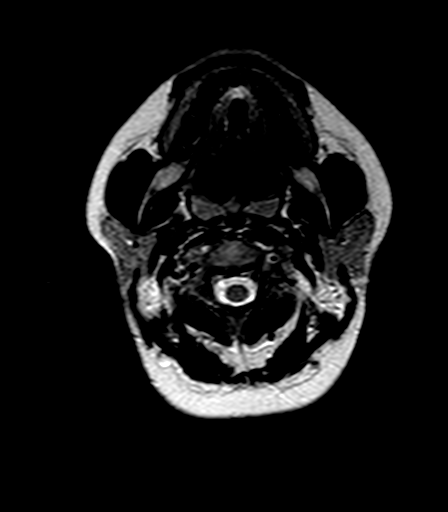

[Series 23: FLAIR · axial · 5.0mm · 0.90mm/px · 1 of 27 slices shown]
[im 1/27]
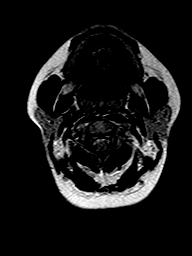

[Series 24: T2 · axial · 5.0mm · 0.45mm/px · 1 of 27 slices shown (2 of 3)]
[im 1/27]
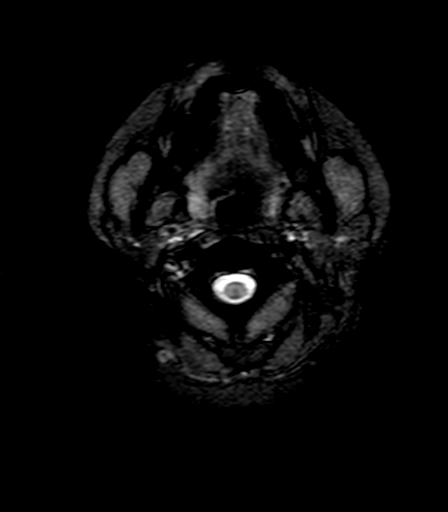

[Series 25: T1 · axial · 3.0mm · 0.45mm/px · z∈[-58,+118]mm · 3 of 60 slices shown (2 of 2)]
[im 1/60]
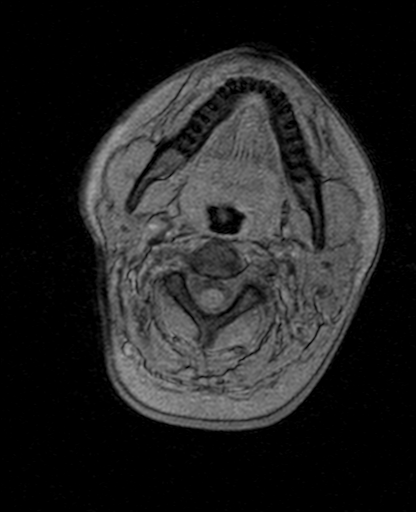
[im 30/60]
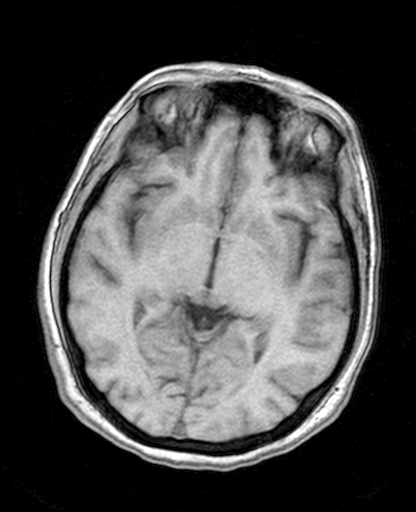
[im 60/60]
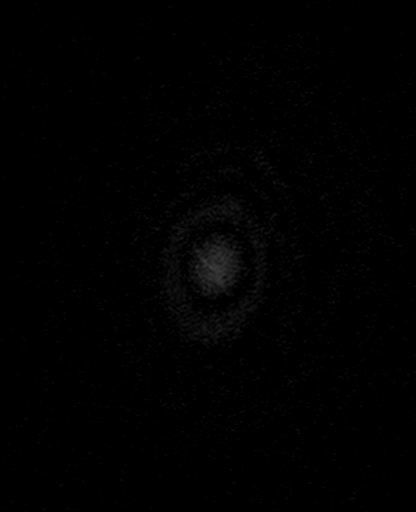

[Series 27: T2 · coronal · 5.0mm · 0.45mm/px · 1 of 29 slices shown (3 of 3)]
[im 1/29]
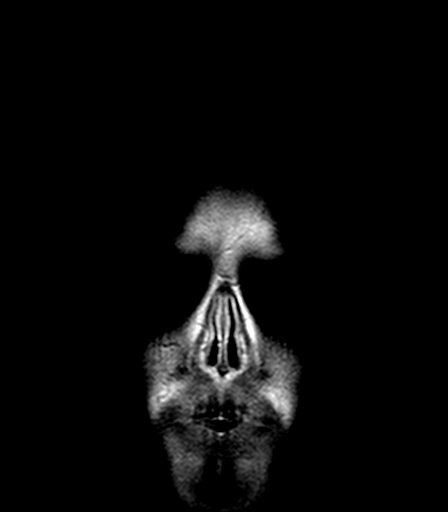

[Series 30: tof_fi3d_tra (res 256) · axial · 1.0mm · 0.45mm/px · z∈[-42,-18]mm · 2 of 124 slices shown]
[im 1/124]
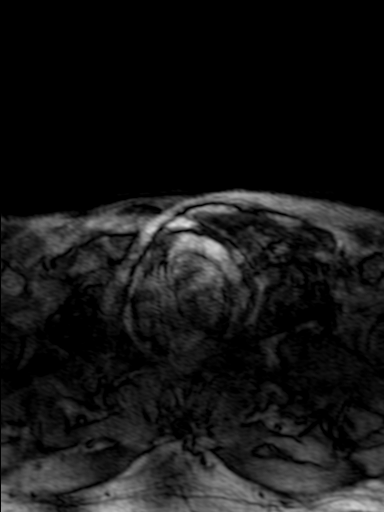
[im 25/124]
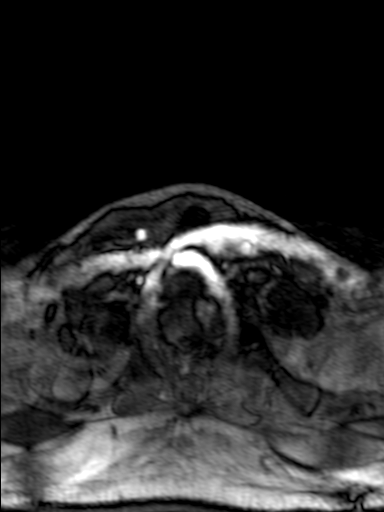

[Series 31: T1 fat-sat · axial · non-contrast · 3.0mm · 0.70mm/px · 1 of 29 slices shown]
[im 1/29]
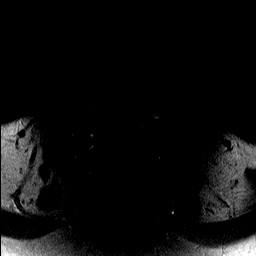

[Series 41: T1 fat-sat post-contrast · axial · 3.0mm · 0.70mm/px · 1 of 29 slices shown]
[im 1/29]
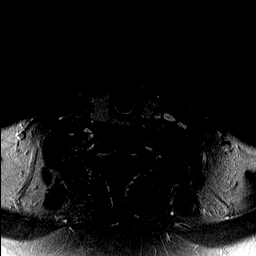

[32 of 48 positions shown; findings below may reference images not displayed]

FINDINGS: MRI HEAD FINDINGS

Brain: The diffusion-weighted images demonstrate no acute or
subacute infarction. Acute hemorrhage or mass lesion is present. The
ventricles are of normal size. No significant white matter disease
is present. The internal auditory canals are within normal limits
bilaterally. Brainstem and cerebellum are normal.

Vascular: Flow is present in the major intracranial arteries.

Skull and upper cervical spine: The skull base is within normal
limits. The craniocervical junction is normal. The upper cervical
spine is within normal limits.

Sinuses/Orbits: A polyp or mucous retention cyst is present within
the right sphenoid sinus. The paranasal sinuses and mastoid air
cells are otherwise clear. Globes and orbits are within normal
limits.

MRA HEAD FINDINGS

The internal carotid artery is are within normal limits from the
high cervical segments through the ICA termini. The right A1 segment
is aplastic. Both A2 segments fill from the left. M1 segments are
within normal limits. The MCA bifurcations are intact. ACA and MCA
branch vessels are within normal limits.

The left vertebral artery essentially ends at the PICA. The right
PICA is visualized and normal. The basilar artery is normal. Both
posterior cerebral arteries originate from the basilar tip. There is
no significant proximal stenosis, aneurysm, or branch vessel
occlusion.

MRA NECK FINDINGS

The time-of-flight images demonstrate no significant flow
disturbance. Flow is antegrade in the vertebral arteries
bilaterally.

There is a common origin of the left common carotid artery in the
innominate artery. The right vertebral artery is the dominant
vessel.

Carotid bifurcations are intact bilaterally. The vertebral arteries
are within normal limits.

MRV HEAD FINDINGS

Time-of-flight images demonstrate normal patency of the dural
sinuses. The right transverse sinus is dominant. The straight sinus
and deep cerebral veins are intact. Cortical veins are unremarkable.
IMPRESSION: 1. Normal MRI appearance of the brain.
2. Polyp or mucous retention cyst within the posterior medial right
sphenoid sinus.
3. Normal variant MRA circle-of-Willis without significant proximal
stenosis, aneurysm, or branch vessel occlusion. MRA neck within
normal limits. No significant stenosis or occlusion.
4. MR venogram of the head demonstrates normal patency of the dural
sinuses and deep cerebral veins.
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
## 2020-11-10 ENCOUNTER — Ambulatory Visit (LOCAL_COMMUNITY_HEALTH_CENTER): Payer: Medicaid Other

## 2020-11-10 ENCOUNTER — Other Ambulatory Visit: Payer: Self-pay

## 2020-11-10 VITALS — BP 130/78 | Ht 64.0 in | Wt 211.5 lb

## 2020-11-10 DIAGNOSIS — Z3201 Encounter for pregnancy test, result positive: Secondary | ICD-10-CM | POA: Diagnosis not present

## 2020-11-10 LAB — PREGNANCY, URINE: Preg Test, Ur: POSITIVE — AB

## 2020-11-10 MED ORDER — PRENATAL 27-0.8 MG PO TABS: 1.0000 | ORAL_TABLET | Freq: Every day | ORAL | 0 refills | Status: AC

## 2020-11-10 NOTE — Progress Notes (Signed)
UPT positive. Plans prenatal care at [facility]. Hx high risk preg. RN counseled to establish prenatal care ASAP. Pt in agreement and plans to call [facility] tomorrow. To DSS for Medicaid/Preg women. Jerel Shepherd, RN

## 2020-11-29 ENCOUNTER — Other Ambulatory Visit: Payer: Self-pay | Admitting: Family Medicine

## 2020-11-29 DIAGNOSIS — O200 Threatened abortion: Secondary | ICD-10-CM

## 2020-11-30 ENCOUNTER — Ambulatory Visit
Admission: RE | Admit: 2020-11-30 | Discharge: 2020-11-30 | Disposition: A | Discharge status: Home / Self Care | Payer: Medicaid Other | Source: Ambulatory Visit | Attending: Family Medicine | Admitting: Family Medicine

## 2020-11-30 ENCOUNTER — Other Ambulatory Visit: Payer: Self-pay

## 2020-11-30 DIAGNOSIS — O200 Threatened abortion: Secondary | ICD-10-CM | POA: Insufficient documentation

## 2020-12-12 ENCOUNTER — Other Ambulatory Visit: Payer: Self-pay | Admitting: Family Medicine

## 2020-12-12 DIAGNOSIS — O200 Threatened abortion: Secondary | ICD-10-CM

## 2020-12-13 ENCOUNTER — Ambulatory Visit
Admission: RE | Admit: 2020-12-13 | Discharge: 2020-12-13 | Disposition: A | Discharge status: Home / Self Care | Payer: Medicaid Other | Source: Ambulatory Visit | Attending: Family Medicine | Admitting: Family Medicine

## 2020-12-13 ENCOUNTER — Other Ambulatory Visit: Payer: Self-pay

## 2020-12-13 DIAGNOSIS — O200 Threatened abortion: Secondary | ICD-10-CM | POA: Insufficient documentation

## 2020-12-15 NOTE — Progress Notes (Signed)
 Chief Complaint:    Karen Brooks is a 30 y.o. female here for threatened abortion.  Patient is [redacted]w[redacted]d weeks by LMP, today u/s showed Gest sac= 1.65 cm= 6.4 wks, no yolk sac or  fetal pole Patient's last menstrual period was 10/03/2020 (approximate). 11/30/20 Brown and pink spotting with voiding a couple of times - had BV at the time Cramping: no Tissue passage: no Blood type is B pos by lab 04/02/19   OB/GYN History:  OB History  Gravida Para Term Preterm AB Living  1            SAB IAB Ectopic Molar Multiple Live Births                 # Outcome Date GA Lbr Len/2nd Weight Sex Delivery Anes PTL Lv  1 Current             Problem list:  Patient Active Problem List  Diagnosis  . Recurrent UTI  . Fibrocystic breast  . Migraines  . Cocaine use  . Elevated LFTs  . Threatened abortion    Medical history:  Past Medical History:  Diagnosis Date  . Fibrocystic breast 04/07/2012  . Recurrent UTI    worked up by pediatrics in the past    Past surgical history:  Past Surgical History:  Procedure Laterality Date  . ear surgery left      Medications:  Current Outpatient Medications:  .  clobetasoL (TEMOVATE) 0.05 % cream, Apply topically 2 (two) times daily, Disp: 30 g, Rfl: 2 .  prenatal vitamin with iron-folic acid  (PRENATAL TABLETS) tablet, Take 1 tablet by mouth once daily, Disp: , Rfl:   Allergies:  Allergies  Allergen Reactions  . Latex Rash   is allergic to latex.  Social History:  Social History   Socioeconomic History  . Marital status: Single  Tobacco Use  . Smoking status: Current Every Day Smoker    Packs/day: 1.00    Years: 6.00    Pack years: 6.00    Types: Cigarettes  . Smokeless tobacco: Never Used  Substance and Sexual Activity  . Alcohol use: Yes    Comment: occasional  . Drug use: Yes    Types: Marijuana  . Sexual activity: Yes    Partners: Male    Birth control/protection: Condom  Social History Narrative   Has a daughter that she  has custody of, boyfriend incarcerated- father of baby, living with her father at the moment, financially dependent on him- he has a job. She was incarcerated and has been released on parole.    Family History:  Family History  Problem Relation Age of Onset  . High blood pressure (Hypertension) Father   . Asthma Mother    family history includes Asthma in her mother; High blood pressure (Hypertension) in her father.    Review of Systems: General:   No fatigue or weight loss Eyes:   No vision changes Ears:   No hearing difficulty Respiratory:                No cough or shortness of breath Pulmonary:   No asthma or shortness of breath Cardiovascular:        No chest pain, palpitations, dyspnea on exertion Gastrointestinal:          No abdominal bloating, chronic diarrhea, constipations, masses, pain or hematochezia Genitourinary:  No hematuria, dysuria, abnormal vaginal discharge, pelvic pain, Menometrorrhagia Lymphatic:  No swollen lymph nodes Musculoskeletal: No muscle weakness Neurologic:  No extremity  weakness, syncope, seizure disorder Psychiatric:  No history of depression, delusions or suicidal/homicidal ideation    Exam:  Constitutional: Vitals:   12/15/20 1121  BP: 124/69  Pulse: 104   Body mass index is 35.94 kg/m.   General Appearance:    Well-developed, well-nourished, no acute distress, appears stated age  Pelvic:  deferred  Extremities:   Extremities normal, atraumatic, no cyanosis or edema  Skin:   Skin color, texture, turgor normal, no rashes or lesions  Neuro: Psych:   Alert, oriented x3  Appropriate mood and insight, judgement intact    Impression:   The encounter diagnosis was Blighted ovum.   Plan:   1. Blighted ovum - Beta HCG, Quantitative, Blood - ABO Grouping and Rho(D) Typing - [facility] - Discussed expectant management vs Miso vs D&C - Return in about 1 week (around 12/22/2020) for appt with MD to discuss D&C after blighted  ovum.     Requested Prescriptions    No prescriptions requested or ordered in this encounter    Orders Placed This Encounter  Procedures  . Beta HCG, Quantitative, Blood    Order Specific Question:   Release to patient    Answer:   Immediate  . ABO Grouping and Rho(D) Typing - [facility]    Order Specific Question:   Release to patient    Answer:   Immediate    Problem List Items Addressed This Visit   None   Visit Diagnoses    Blighted ovum    -  Primary   Relevant Orders   Beta HCG, Quantitative, Blood   ABO Grouping and Rho(D) Typing - [facility]      Return in about 1 week (around 12/22/2020) for appt with MD to discuss D&C after blighted ovum.  I personally performed the service, non-incident to. [facility])     DELON COE, CNM 12/15/2020 12:17 PM
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
## 2021-01-20 ENCOUNTER — Other Ambulatory Visit: Payer: Self-pay | Admitting: Family Medicine

## 2021-01-20 DIAGNOSIS — O039 Complete or unspecified spontaneous abortion without complication: Secondary | ICD-10-CM

## 2021-01-20 DIAGNOSIS — B182 Chronic viral hepatitis C: Secondary | ICD-10-CM

## 2021-01-20 DIAGNOSIS — O020 Blighted ovum and nonhydatidiform mole: Secondary | ICD-10-CM

## 2021-02-16 ENCOUNTER — Other Ambulatory Visit: Payer: Self-pay

## 2021-02-16 ENCOUNTER — Ambulatory Visit
Admission: RE | Admit: 2021-02-16 | Discharge: 2021-02-16 | Disposition: A | Discharge status: Home / Self Care | Payer: Medicaid Other | Source: Ambulatory Visit | Attending: Family Medicine | Admitting: Family Medicine

## 2021-02-16 DIAGNOSIS — O020 Blighted ovum and nonhydatidiform mole: Secondary | ICD-10-CM | POA: Diagnosis not present

## 2021-02-16 DIAGNOSIS — B182 Chronic viral hepatitis C: Secondary | ICD-10-CM | POA: Insufficient documentation

## 2021-02-16 DIAGNOSIS — O039 Complete or unspecified spontaneous abortion without complication: Secondary | ICD-10-CM | POA: Insufficient documentation

## 2021-04-05 ENCOUNTER — Emergency Department
Admission: EM | Admit: 2021-04-05 | Discharge: 2021-04-05 | Discharge status: Against Medical Advice | Payer: Medicaid Other
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
## 2021-07-31 ENCOUNTER — Other Ambulatory Visit: Payer: Self-pay | Admitting: Family Medicine

## 2021-07-31 DIAGNOSIS — M79662 Pain in left lower leg: Secondary | ICD-10-CM

## 2021-08-01 ENCOUNTER — Other Ambulatory Visit: Payer: Self-pay | Admitting: Family Medicine

## 2021-08-01 DIAGNOSIS — M79662 Pain in left lower leg: Secondary | ICD-10-CM

## 2021-08-03 ENCOUNTER — Ambulatory Visit
Admission: RE | Admit: 2021-08-03 | Discharge: 2021-08-03 | Disposition: A | Discharge status: Home / Self Care | Payer: Medicaid Other | Source: Ambulatory Visit | Attending: Family Medicine | Admitting: Family Medicine

## 2021-08-03 DIAGNOSIS — M79662 Pain in left lower leg: Secondary | ICD-10-CM | POA: Insufficient documentation

## 2021-12-01 NOTE — Progress Notes (Signed)
 Pt to clinic for early glucola. Tolerated well.

## 2021-12-21 ENCOUNTER — Ambulatory Visit
Admission: EM | Admit: 2021-12-21 | Discharge: 2021-12-21 | Disposition: A | Discharge status: Home / Self Care | Payer: Medicaid Other | Attending: Family Medicine | Admitting: Family Medicine

## 2021-12-21 DIAGNOSIS — R059 Cough, unspecified: Secondary | ICD-10-CM | POA: Diagnosis present

## 2021-12-21 DIAGNOSIS — J069 Acute upper respiratory infection, unspecified: Secondary | ICD-10-CM | POA: Diagnosis not present

## 2021-12-21 DIAGNOSIS — O99512 Diseases of the respiratory system complicating pregnancy, second trimester: Secondary | ICD-10-CM | POA: Diagnosis not present

## 2021-12-21 DIAGNOSIS — Z3A15 15 weeks gestation of pregnancy: Secondary | ICD-10-CM | POA: Diagnosis not present

## 2021-12-21 DIAGNOSIS — F17210 Nicotine dependence, cigarettes, uncomplicated: Secondary | ICD-10-CM | POA: Diagnosis not present

## 2021-12-21 DIAGNOSIS — R0981 Nasal congestion: Secondary | ICD-10-CM | POA: Diagnosis present

## 2021-12-21 DIAGNOSIS — O99332 Smoking (tobacco) complicating pregnancy, second trimester: Secondary | ICD-10-CM | POA: Diagnosis not present

## 2021-12-21 DIAGNOSIS — Z20822 Contact with and (suspected) exposure to covid-19: Secondary | ICD-10-CM | POA: Diagnosis not present

## 2021-12-21 LAB — GROUP A STREP BY PCR: Group A Strep by PCR: NOT DETECTED

## 2021-12-21 LAB — SARS CORONAVIRUS 2 BY RT PCR: SARS Coronavirus 2 by RT PCR: NEGATIVE

## 2021-12-21 NOTE — ED Provider Notes (Signed)
[facility]    CSN: [id number] Arrival date & time: 12/21/21  1606      History   Chief Complaint No chief complaint on file.   HPI Karen Brooks is a 31 y.o. female.   HPI   [name] presents for nasal congestion with cough, sore throat, headache, runny nose, ear discomfort.  Denies chest pain, shortness of breath, neck pain, fever.  She has not taken anything for symptoms.  Symptoms started a week ago.  Of note, she is ~[redacted] weeks pregnant.  She has the tingling sensation in her stomach has not changed since she was sick.  There has been no vaginal bleeding, leaking of fluid or contractions.      Past Medical History:  Diagnosis Date   Chronic hepatitis C (HCC)    Gestational hypertension    Polysubstance abuse (HCC)    MJ, cocaine, heroin   Stillbirth    Tobacco use     Patient Active Problem List   Diagnosis Date Noted   Itching 03/11/2019   Chronic hepatitis C affecting pregnancy, antepartum (HCC) 11/03/2018   History of IUFD 33 1/7 11/03/2018   History of pregnancy induced hypertension 11/03/2018   Trichomonal infection 11/03/2018   Abnormal Pap smear of cervix 11/03/2018   Drug use affecting pregnancy, antepartum 11/03/2018   Depression, recurrent (HCC) 11/03/2018   Tobacco use affecting pregnancy, antepartum 1/2-1 ppd 11/03/2018   Cocaine abuse (HCC) 09/17/2017   Polysubstance abuse (HCC) 06/14/2017   Migraines 12/30/2012   Fibrocystic breast 04/07/2012    Past Surgical History:  Procedure Laterality Date   EXTERNAL EAR SURGERY     WISDOM TOOTH EXTRACTION      OB History     Gravida  4   Para  2   Term  1   Preterm  1   AB  0   Living  1      SAB  0   IAB  0   Ectopic  0   Multiple  0   Live Births  1            Home Medications    Prior to Admission medications   Medication Sig Start Date End Date Taking? Authorizing Provider  aspirin EC 81 MG tablet Take 81 mg by mouth daily. Preeclampsia ppx   Yes  [provider]  Prenatal Vit-Fe Fumarate-FA (MULTIVITAMIN-PRENATAL) 27-0.8 MG TABS tablet Take 1 tablet by mouth daily at 12 noon.   Yes [provider]  buPROPion (WELLBUTRIN XL) 150 MG 24 hr tablet Take 1 tablet (150 mg total) by mouth daily. Patient not taking: Reported on 11/03/2018 08/21/17   Conard Novak, MD  guaiFENesin-dextromethorphan [facility] DM) 100-10 MG/5ML syrup Take 5 mLs by mouth every 4 (four) hours as needed for cough. Patient not taking: Reported on 11/10/2020    [provider]  hydrOXYzine (ATARAX/VISTARIL) 25 MG tablet Take 1 tablet (25 mg total) by mouth every 6 (six) hours. Patient not taking: Reported on 11/03/2018 06/14/17   Farrel Conners, CNM    Family History Family History  Problem Relation Age of Onset   Healthy Father    Asthma Maternal Grandmother    COPD Paternal Grandmother     Social History Social History   Tobacco Use   Smoking status: Every Day    Packs/day: 0.50    Years: 16.00    Total pack years: 8.00    Types: Cigarettes   Smokeless tobacco: Never  Vaping Use  Vaping Use: Never used  Substance Use Topics   Alcohol use: Not Currently    Comment: last use- 11/07/20- malt liquor - couple drinks   Drug use: Not Currently    Types: Heroin    Comment: last use- few yrs ago     Allergies   Latex   Review of Systems Review of Systems: negative unless otherwise stated in HPI.      Physical Exam Triage Vital Signs ED Triage Vitals  Enc Vitals Group     BP 12/21/21 1635 107/79     Pulse Rate 12/21/21 1635 (!) 103     Resp --      Temp 12/21/21 1635 97.9 F (36.6 C)     Temp Source 12/21/21 1635 Oral     SpO2 12/21/21 1635 96 %     Weight 12/21/21 1633 201 lb 12.8 oz (91.5 kg)     Height 12/21/21 1633 5' 4.5" (1.638 m)     Head Circumference --      Peak Flow --      Pain Score 12/21/21 1632 10     Pain Loc --      Pain Edu? --      Excl. in GC? --    No data found.  Updated Vital  Signs BP 107/79 (BP Location: Left Arm)   Pulse (!) 103   Temp 97.9 F (36.6 C) (Oral)   Ht 5' 4.5" (1.638 m)   Wt 91.5 kg   SpO2 96%   BMI 34.10 kg/m   Visual Acuity Right Eye Distance:   Left Eye Distance:   Bilateral Distance:    Right Eye Near:   Left Eye Near:    Bilateral Near:     Physical Exam GEN:     alert, non-toxic appearing female in no distress    HENT:  mucus membranes moist, oropharyngeal without lesions or exudate, no tonsillar hypertrophy,  mild oropharyngeal erythema ,  moderate erythematous hypertrophied turbinates, clear nasal discharge, bilateral TM normal EYES:   pupils equal and reactive, EOMi, no scleral injection NECK:  normal ROM, no lymphadenopathy, no meningismus   RESP:  no increased work of breathing, clear to auscultation bilaterally CVS:   regular rate and rhythm Skin:   warm and dry, normal skin turgor    UC Treatments / Results  Labs (all labs ordered are listed, but only abnormal results are displayed) Labs Reviewed  SARS CORONAVIRUS 2 BY RT PCR  GROUP A STREP BY PCR    EKG   Radiology No results found.  Procedures Procedures (including critical care time)  Medications Ordered in UC Medications - No data to display  Initial Impression / Assessment and Plan / UC Course  I have reviewed the triage vital signs and the nursing notes.  Pertinent labs & imaging results that were available during my care of the patient were reviewed by me and considered in my medical decision making (see chart for details).       Pt is a 31 y.o. female who presents for a week of respiratory symptoms. [name] is afebrile here without recent antipyretics. Satting well on room air. Overall pt is  well hydrated, without respiratory distress. Pulmonary exam is unremarkable. COVID testing obtained and was negative. Strep PCR negative. History consistent with viral respiratory illness. Discussed symptomatic treatment. Pregnancy safe medications  reviewed.  - continue Tylenol  as needed for discomfort/fever/irritability  - nasal saline to help with his nasal congestion - Use a mist humidifier  to help with breathing - Stressed importance of hydration - Discussed return and ED precautions, understanding voiced.   Discussed MDM, treatment plan and plan for follow-up with patient/parent who agrees with plan.      Final Clinical Impressions(s) / UC Diagnoses   Final diagnoses:  Viral URI with cough     Discharge Instructions      We will contact you if your COVID test is positive.  Please quarantine while you wait for the results.  If your test is negative you may resume normal activities.  If your test is positive please continue to quarantine for at least 5 days from your symptom onset or until you are without a fever for at least 24 hours after the medications.   You can take Tylenol as needed for fever reduction and pain relief.    For cough: honey 1/2 to 1 teaspoon (you can dilute the honey in water or another fluid).  You can also use guaifenesin and dextromethorphan for cough. You can use a humidifier for chest congestion and cough.  If you don't have a humidifier, you can sit in the bathroom with the hot shower running.      For sore throat: try warm salt water gargles, cepacol lozenges, throat spray, warm tea or water with lemon/honey, popsicles or ice, or OTC cold relief medicine for throat discomfort.    For congestion: take a daily anti-histamine like Zyrtec, Claritin, or saline rinses.    It is important to stay hydrated: drink plenty of fluids (water, gatorade/powerade/pedialyte, juices, or teas) to keep your throat moisturized and help further relieve irritation/discomfort.    Return or go to the Emergency Department if symptoms worsen or do not improve in the next few days       ED Prescriptions   None    PDMP not reviewed this encounter.   Katha Cabal, DO 12/21/21 1740

## 2021-12-21 NOTE — Discharge Instructions (Addendum)
We will contact you if your COVID test is positive.  Please quarantine while you wait for the results.  If your test is negative you may resume normal activities.  If your test is positive please continue to quarantine for at least 5 days from your symptom onset or until you are without a fever for at least 24 hours after the medications.   You can take Tylenol as needed for fever reduction and pain relief.    For cough: honey 1/2 to 1 teaspoon (you can dilute the honey in water or another fluid).  You can also use guaifenesin and dextromethorphan for cough. You can use a humidifier for chest congestion and cough.  If you don't have a humidifier, you can sit in the bathroom with the hot shower running.      For sore throat: try warm salt water gargles, cepacol lozenges, throat spray, warm tea or water with lemon/honey, popsicles or ice, or OTC cold relief medicine for throat discomfort.    For congestion: take a daily anti-histamine like Zyrtec, Claritin, or saline rinses.    It is important to stay hydrated: drink plenty of fluids (water, gatorade/powerade/pedialyte, juices, or teas) to keep your throat moisturized and help further relieve irritation/discomfort.    Return or go to the Emergency Department if symptoms worsen or do not improve in the next few days

## 2021-12-21 NOTE — ED Triage Notes (Signed)
Patient reports that she has had congestion, sore throat, and chills.   Patient reports she has had symptoms for about a week.

## 2022-04-23 NOTE — L&D Delivery Note (Signed)
 Brief Labor Progress Note  Subjective: Patient is uncomfortable and wants to sleep  Objective: BP 110/49   Pulse 75   Temp 36.4 C (97.6 F) (Oral)   Resp 18   Ht 163.8 cm (5' 4.5)   Wt 97 kg (213 lb 12.8 oz)   LMP 09/04/2021   SpO2 98%   BMI 36.13 kg/m   SVE: 2200 FB, miso  0100 FB out  0300 3/th/hi 0400 miso x2  - Pit currently at Dose (milli-units/min) Oxytocin : 0 milli-units/min -  ,  ,  ,   - PPH Risk Score: 0 (06/05/22 0410); Low risk (0)  - reviewed.  - Pain Control: Epidural in place  FHT:  - Category 1: 130 / mod variability / + accels / - decels - infrequent  A/P: Karen Brooks is a 32 y.o. [id number] at [redacted]w[redacted]d admitted for hx IUFD and gHTN, currently in induced labor.  - s/p second dose of miso. - Consider miso vs. Pitocin  at 0800 - Cat 1 tracing; appropriate to continue with induction  gHTN -HELLP labs wnl  -mild to normal range since admission  # Hepatitis C:  -11/14/21 VL: 378,[id number]. GI referral postpartum  -consider delaying AROM until patient is regularly contracting with lower station

## 2022-04-23 NOTE — L&D Delivery Note (Signed)
 Brief Labor Progress Note  Subjective: Strip note.  Patient is uncomfortable and has an issue with epidural. Anesthesia aware and addressing. Will see patient after this is resolved  Objective: BP 147/89   Pulse 75   Temp 36.8 C (98.2 F) (Oral)   Resp 18   Ht 163.8 cm (5' 4.5)   Wt 97 kg (213 lb 12.8 oz)   LMP 09/04/2021   SpO2 96%   BMI 36.13 kg/m   SVE: 2200 FB, miso  0100 FB out  0300 3/th/hi 0400 miso x2  - Pit currently at Dose (milli-units/min) Oxytocin : 0 milli-units/min -  ,  ,  ,   - PPH Risk Score: 0 (06/05/22 0820); Low risk (0)  - reviewed.  - Pain Control: Epidural in place  FHT:  - Category 1: 130 / mod variability / + accels / - decels - q2-3  A/P: Karen Brooks is a 32 y.o. [id number] at [redacted]w[redacted]d admitted for hx IUFD and gHTN, currently in induced labor.  Recheck and decide miso vs. Pitocin  once pain under control. - Cat 1 tracing; appropriate to continue with induction  gHTN -HELLP labs wnl  -mild to normal range since admission  # Hepatitis C:  -11/14/21 VL: 378,315. GI referral postpartum  -consider delaying AROM until patient is regularly contracting with lower station  Midwifery Level of Care  This patient is eligible for the following midwifery care: Co-management: This patient requires co-management with a physician for the following condition(s) in the intrapartum or postpartum period as indicated: IP: HIV, Hep C, Hep B, or Syphilis active infection and/or treatment. In the intrapartum period, the midwife will manage the patient's labor and the physician will manage the medical condition above. The midwife will or has performed the following task(s): Order medications only related to labor. and Order labs only related to labor.

## 2022-04-23 NOTE — L&D Delivery Note (Signed)
 VAGINAL DELIVERY NOTE   Pre-Operative Diagnosis: 1. [redacted]w[redacted]d pregnancy 2. GHTN 3. Hep C 4. Hx of depression 5. Hx of IUFD 6. Hx of Polysubstance use 7. Tobacco use 8. Hx of cervical dysplasia 9. Obesity 10. Hx of Pre E in prior pregnancy  Post-Operative Diagnosis: Same; delivered  Delivery Type: Vaginal, Spontaneous  Delivery Clinician: VENANCIO TYLENE NORRIS  Delivery Anesthesia: Nitrous Oxide;Epidural  Labor Complications: None   Labor Course: Patient was admitted for Induction of labor with subsequent normal labor course.   Delivery Note:  Delivery Complications: Complications in delivery: none.Midwife came to the room to assess patient and noted significant vaginal bleeding with patient reporting rectal pressure. Pt was found to be complete. She began to push. AROM to clear fluid to expedite delivery.  The infant's head was delivered. There was no nuchal cord, though the cord was knotted around the foot, which was removed after delivery.The shoulders and body followed with ease. There was terminal meconium present. Cord clamping was delayed 1 minutes, and then the cord was clamped and cut. The infant was placed on the maternal abdomen. The placenta was delivered as described below. NCCU was present given recent maternal bleeding. They left after observing vigorous cry of baby.  Postpartum hemorrhage was not noted.  The sponge, lap, and needle counts were correct and verified with the nurse in attendance.  Perineal Lacerations: 3a;3rd , repaired in standard fashion. Dr. Lunette evaluated and repaired the laceration.  3rd+ degree laceration. The patient's perineum was inspected and the patient was found to have a 3a, third degree perineal laceration (<50% of the EAS torn), which was repaired in the following fashion: The EAS was reapproximated end-to-end with interrupted stitches of zero-vicryl in 2 planes: superior and anterior. Rectal exam performed.The second degree was then  repaired in the standard fashion, aside not repairing the subcutaneous tissue as patient was without an epidural and pain was not well tolerated.   Quantitative Blood Loss:  100 ml at time of leaving room  Findings: 1) female infant, Apgar scores of    at 1 minute and    at 5 minutes and birthweight pending per protocol   Placenta: Appearance: Intact  Removal: Spontaneous    Disposition:    Blood gases sent? no  Neonatal Disposition:  Infant to infant to room in with mother.   Pregnancy risk: The risk of the pregnancy was high. Maternal/pregnancy risk factors include: Hypertension, Obesity, Substance abuse, and Other (Hep C, tobacco use)  Attending Attestation: I was present for this vaginal delivery.  Dr. Lunette was immediately available for the delivery.
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
## 2022-06-04 NOTE — Care Plan (Signed)
  Problem: Latex Allergy Goal: Absence of Allergy Symptoms Outcome: Progressing   Problem: Adult Inpatient Plan of Care Goal: Plan of Care Review Outcome: Progressing Goal: Patient-Specific Goal (Individualized) Outcome: Progressing Goal: Absence of Hospital-Acquired Illness or Injury Outcome: Progressing Goal: Optimal Comfort and Wellbeing Outcome: Progressing Goal: Readiness for Transition of Care Outcome: Progressing Goal: Rounds/Family Conference Outcome: Progressing   Problem: Labor Goal: Hemostasis Outcome: Progressing Goal: Stable Fetal Wellbeing Outcome: Progressing Goal: Effective Progression to Delivery Outcome: Progressing Goal: Absence of Infection Signs and Symptoms Outcome: Progressing Goal: Acceptable Pain Control Outcome: Progressing Goal: Normal Uterine Contraction Pattern Outcome: Progressing

## 2022-06-06 NOTE — Discharge Summary (Signed)
 Obstetrics Discharge Summary  Admit Date: 06/04/2022  Discharge Date: 06/07/2022   Discharge To: Home  Discharge Provider: [name], CNM, CNM  Delivery Type: Vaginal, Spontaneous   Gestational Age at Delivery:  Information for the patient's newborn:  Karen, Brooks [[id number]]  Gestational Age: [redacted]w[redacted]d   Delivery Clinician: VENANCIO TYLENE NORRIS   Delivery Anesthesia: Nitrous Oxide;Epidural   Labor Complications: None   Lacerations: 3a;3rd   Other Procedures:    Postpartum Complications: None  Hospital Problems Addressed During this Hospitalization: Principal Problem:   SVD (spontaneous vaginal delivery) Active Problems:   Chronic hepatitis C affecting pregnancy, antepartum (CMS-HCC)   History of depression   History of IUFD   History of Polysubstance use disorder   Tobacco use disorder   Obesity in pregnancy   Birth Control Planned at Discharge: Nexplanon at pp visit  Infant Feeding Method at Discharge: L&D Feeding Options: formula  Hospital Course:  Karen Brooks is a 32 y.o. [id number] admitted on 06/04/2022 to labor and delivery for an induction of labor secondary to gestational hypertension. Labor was uncomplicated. She had a spontaneous vaginal delivery at [redacted]w[redacted]d on 06/05/2022. Delivery was uncomplicated. Her pregnancy and postpartum courses were complicated by the pertinent problems below.   Gestational hypertension Normal to mild range BPs this admission with negative HELLP labs and no symptoms of pre-eclampsia.  Plan 1 week BP check.   Hepatitis C Genotype 1b. Patient's last HCV RNA 7/25: 621K. A repeat viral load was ordered on admission and was 510,644/Log 5.71. A GI referral was placed in her discharge orders.  3a laceration Bowel regimen and 2 week laceration check. Patient did require oxycodone  for pain and she was counseled on pain management and was given a small supply for home use as needed for indicated pain management.    Bleeding at epidural site intrapartum There was concern for possible epidural hematoma but ultimately determined not to be.  Had normal coagulation studies and normal exam postpartum.   Tobacco use disorder Patient reports 1/2 pack use per day. She declined nictotine replacement while inpatient.    History of polysubstance use disorder Had screen positive for cocaine in 10/2021 with multiple subsequent screens all negative up until 05/08/22.  Admission UDS was positive for cocaine, although she denied use of anything but marijuana a week prior to delivery.  Social work consult was placed.    History of depression and IUFD Patient was scheduled for 2 week mood check postpartum.  During the postpartum period she met all of her postpartum goals including pain control with oral medications, tolerating a regular diet, ambulating well, and voiding spontaneously. She was discharged home on PPD#2 in stable condition. She was counseled on routine postpartum care and to follow-up with her OB in 1 week for a blood pressure check, 2 weeks for a mood/laceration check, and 4-6 weeks for routine postpartum care.    Day of Discharge Services: Patient was seen and examined by the primary team and deemed stable for discharge.  BP 125/59   Pulse 79   Temp 36.5 C (97.7 F) (Oral)   Resp 20   Ht 163.8 cm (5' 4.5)   Wt 97 kg (213 lb 12.8 oz)   LMP 09/04/2021   SpO2 98%   Breastfeeding Yes   BMI 36.13 kg/m  General: No acute distress Respiratory: Normal work of breathing Abdomen: soft, non-distended, fundus firm below the umbilicus, nontender to palpation GU: minimal vaginal bleeding Extremities: no calf pain bilaterally, 1+ edema  bilaterally  Condition at Discharge: good  Pertinent Labs: HELLP  No results found for requested labs within last 2 days.  [  CBC Results in Past 2 Days Result Component Current Result  WBC 13.5 (H) (06/06/2022)  RBC 4.39 (06/06/2022)  HCT 38.3 (06/06/2022)  HGB 12.8  (06/06/2022)  Platelet >174 (06/06/2022)  MCV 87.1 (06/06/2022)  MCH 29.1 (06/06/2022)  MCHC 33.4 (06/06/2022)  RDW 13.2 (06/06/2022)    Lab Results  Component Value Date   WBC 13.5 (H) 06/06/2022   HGB 12.8 06/06/2022   HCT 38.3 06/06/2022   PLT >174 06/06/2022    Invalid input(s): POCTBG  Pending Test Results:     Discharge Medications:    Your Medication List     STOP taking these medications    aspirin 81 MG tablet Commonly known as: ECOTRIN   multivitamin, prenatal (folic acid -iron) 27-1 mg Tab   norethindrone  0.35 mg tablet Commonly known as: ORTHO MICRONOR        START taking these medications    benzocaine -menthol  20-0.5 % Aero Commonly known as: DERMOPLAST Apply 1 spray topically two (2) times a day as needed (laceration pain).   docusate sodium  100 MG capsule Commonly known as: COLACE Take 1 capsule (100 mg total) by mouth two (2) times a day as needed for constipation.   ibuprofen  600 MG tablet Commonly known as: MOTRIN  Take 1 tablet (600 mg total) by mouth every six (6) hours.   oxyCODONE  5 MG immediate release tablet Commonly known as: ROXICODONE  Take 1 tablet (5 mg total) by mouth every six (6) hours as needed for up to 5 days.   polyethylene glycol 17 gram packet Commonly known as: MIRALAX Take 17 g by mouth daily for 15 days.       CHANGE how you take these medications    acetaminophen  500 MG tablet Commonly known as: TYLENOL  Take 1 tablet (500 mg total) by mouth every six (6) hours as needed. What changed:  medication strength how much to take       CONTINUE taking these medications    melatonin 1 mg Tab tablet Take 3 tablets (3 mg total) by mouth nightly.   multivitamin, prenatal (folic acid -iron fumarate) 28 mg iron- 800 mcg Tab Take 1 tablet by mouth daily.         Immunizations: Immunization History  Administered Date(s) Administered  . COVID-19 VAC,BIVALENT(30YR UP),PFIZER 09/19/2021  . COVID-19  VAC,MRNA,TRIS(12Y UP)(PFIZER)(GRAY CAP) 01/08/2020  . COVID-19 VACC,MRNA,(PFIZER)(PF) 01/29/2020  . DTP 10/06/1990  . DTaP, Unspecified Formulation 04/09/1990, 06/23/1990, 09/08/1991, 08/02/1995  . HPV Quadrivalent (Gardasil) 05/02/2011, 04/07/2012, 07/25/2012  . Haemophilis Influenza Type B Vaccine Hboc 10/06/1990  . Hepatitis A Vaccine Pediatric / Adolescent 2 Dose IM 10/22/2006, 04/21/2007  . Hepatitis B Vaccine, Unspecified Formulation 01/02/2002, 02/20/2002, 07/17/2002  . HiB, unspecified 04/09/1990, 06/23/1990, 09/08/1991  . INFLUENZA TIV (TRI) 25MO+ W/ PRESERV (IM) 01/27/2013  . Influenza Vaccine Quad(IM)6 MO-Adult(PF) 03/18/2017, 09/25/2018, 01/20/2019, 01/27/2020, 01/19/2021, 12/28/2021  . Influenza Virus Vaccine, unspecified formulation 12/29/2010, 04/07/2012, 03/14/2014, 05/20/2015, 09/25/2018, 01/03/2019, 01/20/2019  . MMR 09/08/1991, 08/02/1995  . Pneumococcal Conjugate 20-valent 01/19/2021  . Polio Virus Vaccine, Unspecified Formulation 04/09/1990, 06/23/1990, 10/06/1990, 09/08/1991, 08/02/1995  . RSV VACCINE, BIVALENT (PF) (ABRYSVO) 04/27/2022  . TD(TDVAX),ADSORBED,2LF(IM)(PF) 11/10/2002  . TdaP 12/29/2010, 05/29/2017, 02/03/2019, 04/04/2022    Discharge Instructions: When you go home from the hospital you should contact the provider who took care of you during your pregnancy. For NON-URGENT medical issues, please contact your pregnancy care provider during business hours  or send a MyChart message. If you cannot reach your pregnancy health care provider call the [facility] OB/GY[facility] clinic at [phone] during business hours (8-4:00 pm). For URGENT medical issues after hours, weekends, or holidays, please call the [facility] hospital operator at [phone] and ask for the on-call OB provider.  Follow-up care: After birth you should be seen by your pregnancy health care provider within 4 to 6 weeks. You may need to be seen sooner if your provider tells you to. See your discharge summary for  specific follow-up.  Warning Signs/When to call your provider  When should you call for help?  Call 911 anytime you think you may need emergency care. For example, call if:  You have thoughts of harming yourself, your baby, or another person  You passed out (lost consciousness)  You have chest pain, are short of breath, or cough up blood  You have a seizure  Call your health care provider now or seek immediate medical care if:  If you had a C-section and:  The skin around your incision is turning pink or red  Your incision opens  There is blood or any fluid is coming out of your incision  You have a fever of 100.79F or higher  You are soaking through a pad each hour for 2 or more hours  Your vaginal bleeding or discharge seems to be getting heavier and doesn't go down with rest  You are dizzy or lightheaded, or you feel like you may faint  Your pain is getting worse or you need more pain medicine than the day before.  You have signs of a blood clot in your leg (called a deep vein thrombosis), such as:  Pain in the calf, back of the knee, thigh, or groin  Redness and swelling in your leg or groin  You have signs of postpartum preeclampsia, such as:  Severe abdominal pain  New vision problems (such as dimness, blurring, or seeing spots)  A bad headache that does not go away with medicine  You do not get better as expected in your postpartum period   Scribe's Attestation: Rollene Free, CNM obtained and performed the history, physical exam and medical decision making elements that were entered into the chart. Documentation assistance was provided by me personally, a scribe. Signed by Fredia Boy, Scribe, on June 06, 2022 at 8:04 AM.     -------------------------------------------------------------------------------------------------------------------------------------------------------------------------------------------------------------------------------------------------------------- June 07, 2022 9:05 AM Documentation assistance provided by the Scribe. I was present during the time the encounter was recorded. The information recorded by the Scribe was done at my direction and has been reviewed and validated by me. ---------------------------------------------------------------------------------------------------------------------------------------------------------------------------------------------------------------------------------------------------------------

## 2022-06-07 NOTE — Telephone Encounter (Signed)
 Hello, Karen Brooks needs the following visit(s) scheduled:  1 wk BP check 2 wk mood check 2 wk laceration check 4-6 wk PP visit  Thank you, [name], CNM
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
## 2022-06-07 NOTE — Telephone Encounter (Signed)
 error

## 2022-06-19 NOTE — Progress Notes (Signed)
 Outpatient OB Note: Routine postpartum visit  ASSESSMENT AND PLAN   Problem List Items Addressed This Visit     History of depression    EPDS score is 9 today. See HPI for details. Social worker contacted to share mental health resources with patient because she is interested in counseling. She notes no thoughts of harm to self and others. She understands that she needs to call clinic or present to ED or triage if she notices a change in mood such that she is concerned about her safety or safety of others relative to her.       SVD (spontaneous vaginal delivery) - Primary    Patient with some lower back pain and inner thigh pain since delivery. NSAIDs helps a little bit but does not take tylenol  often due to hepatitis c. Patient advised to continue with NSAIDS and voltaren gel also prescribed for lower back pain.       History of cervical dysplasia    Plan for pap smear at 3/22 visit when patient has less pain from lac.      Hx of pre-eclampsia in prior pregnancy, currently pregnant    Normotensive today in clinic.        Obstetric vaginal laceration with type 3a third degree perineal laceration    Intact w/ no signs of infection on exam with some suture still palpable. Advised to use donut for sitting comfort and to continue taking NSAIDs for pain management as well as bowel regimen.        - Contraception plan: nexplanon- is considering getting it at next appointment on 3/22 - Pap due: patient due for one- consider at next visit once patient is having less pain with laceration  No follow-ups on file.  Karen K Joudeh, MD  SUBJECTIVE   32 y.o. [phone] presents for a routine postpartum visit after SVD on 2/13.   She reports that she is doing well with the transition to postnatal life. Her infant is healthy and doing well. They are bonding well. She is formula feeding. Her mood is okay. She notes that she sometimes feels sad and cries for no good reason. She notes that she gets  a lot of joy and happiness from her children and that is the purpose in her life right now. She sometimes cries happy tears. She denies thoughts of harming self or others. Good support at home especially from her grandmother . Feels safe at home.  She otherwise has no complaints or concerns today.  Her past medical, surgical, obstetrical, and gynecological history were reviewed and updated today.  OBJECTIVE   BP 129/74 (BP Site: L Arm, BP Position: Sitting, BP Cuff Size: Large)   Pulse 91   Ht 163.8 cm (5' 4.49)   Wt 89.2 kg (196 lb 11.2 oz)   LMP 09/04/2021   BMI 33.25 kg/m  General: NAD Abdomen: soft Genitalia: normal appearing vulva, perineal laceration is healing well with some residual suture present and no signs of infection; 3a laceration intact based on digital vaginal exam Extremities: no swelling Psychological: pleasant and interactive, answers questions appropriately   LABS   Results for orders placed or performed during the hospital encounter of 06/04/22  Comprehensive Metabolic Panel  Result Value Ref Range   Sodium 139 135 - 145 mmol/L   Potassium 3.9 3.4 - 4.8 mmol/L   Chloride 108 (H) 98 - 107 mmol/L   CO2 21.0 20.0 - 31.0 mmol/L   Anion Gap 10 5 - 14 mmol/L  BUN 10 9 - 23 mg/dL   Creatinine 9.36 9.44 - 1.02 mg/dL   BUN/Creatinine Ratio 16    eGFR CKD-EPI (2021) Female >90 >=60 mL/min/1.12m2   Glucose 68 (L) 70 - 179 mg/dL   Calcium  9.0 8.7 - 10.4 mg/dL   Albumin 2.8 (L) 3.4 - 5.0 g/dL   Total Protein 6.7 5.7 - 8.2 g/dL   Total Bilirubin 0.4 0.3 - 1.2 mg/dL   AST 25 <=65 U/L   ALT 23 10 - 49 U/L   Alkaline Phosphatase 187 (H) 46 - 116 U/L  Protein/Creatinine Ratio, Urine  Result Value Ref Range   Creat U 131.5 Undefined mg/dL   Protein, Ur 66.9 Undefined mg/dL   Protein/Creatinine Ratio, Urine 0.251 Undefined  CBC  Result Value Ref Range   Results Verified by Slide Scan Slide Reviewed    WBC 15.7 (H) 3.6 - 11.2 10*9/L   RBC 5.08 3.95 - 5.13  10*12/L   HGB 14.8 11.3 - 14.9 g/dL   HCT 55.9 65.9 - 55.9 %   MCV 86.5 77.6 - 95.7 fL   MCH 29.2 25.9 - 32.4 pg   MCHC 33.7 32.0 - 36.0 g/dL   RDW 86.8 87.7 - 84.7 %   MPV 11.4 (H) 6.8 - 10.7 fL   Platelet 204 150 - 450 10*9/L  Syphilis Screen  Result Value Ref Range   RPR Nonreactive Nonreactive  Fibrinogen  Result Value Ref Range   Fibrinogen 437 175 - 500 mg/dL  PT-INR  Result Value Ref Range   PT 8.9 (L) 9.9 - 12.6 sec   INR 0.79   aPTT  Result Value Ref Range   APTT 30.3 24.8 - 38.4 sec   Heparin Correlation 0.2   CBC  Result Value Ref Range   WBC 17.4 (H) 3.6 - 11.2 10*9/L   RBC 4.98 3.95 - 5.13 10*12/L   HGB 14.7 11.3 - 14.9 g/dL   HCT 56.9 65.9 - 55.9 %   MCV 86.4 77.6 - 95.7 fL   MCH 29.5 25.9 - 32.4 pg   MCHC 34.2 32.0 - 36.0 g/dL   RDW 86.9 87.7 - 84.7 %   MPV 11.2 (H) 6.8 - 10.7 fL   Platelet 193 150 - 450 10*9/L  Toxicology Screen, Urine  Result Value Ref Range   Amphetamines Screen, Ur Negative <500 ng/mL   Barbiturates Screen, Ur Negative <200 ng/mL   Benzodiazepines Screen, Urine Negative <200 ng/mL   Cannabinoids Screen, Ur Negative <20 ng/mL   Methadone Screen, Urine Negative <300 ng/mL   Cocaine(Metab.)Screen, Urine Positive (A) <150 ng/mL   Opiates Screen, Ur Negative <300 ng/mL   Fentanyl  Screen, Ur Negative <1.0 ng/mL   Oxycodone  Screen, Ur Negative <100 ng/mL   Buprenorphine, Urine Negative <5 ng/mL  CBC  Result Value Ref Range   Results Verified by Slide Scan Slide Reviewed    WBC 13.5 (H) 3.6 - 11.2 10*9/L   RBC 4.39 3.95 - 5.13 10*12/L   HGB 12.8 11.3 - 14.9 g/dL   HCT 61.6 65.9 - 55.9 %   MCV 87.1 77.6 - 95.7 fL   MCH 29.1 25.9 - 32.4 pg   MCHC 33.4 32.0 - 36.0 g/dL   RDW 86.7 87.7 - 84.7 %   MPV     Platelet >174 150 - 450 10*9/L  Hepatitis C RNA, Quantitative, PCR  Result Value Ref Range   HCV RNA Detected (A) Not Detected   HCV RNA (IU) 510,644 (H) <=0 IU/mL   HCV  RNA Log(10) 5.71 (H) <0.00 log IU/mL  Type and Screen   Result Value Ref Range   ABO Grouping B POS    Antibody Screen NEG   POCT Glucose  Result Value Ref Range   Glucose, POC 107 70 - 179 mg/dL

## 2022-06-21 NOTE — Progress Notes (Signed)
Immediately after or during the visit, I reviewed with the resident the medical history and the resident???s findings on physical examination.?? I discussed with the resident the patient???s diagnosis and concur with the treatment plan as documented in the resident note. Cora Collum, MD

## 2022-06-21 NOTE — Progress Notes (Signed)
 Social Work Progress Note                                           OB/GYN Clinic  Referral Source: Dr. Enos Anger   Referral Reason: Mental Health Resources  Summary: SW placed call to Pt to follow up on referral from Dr. Anger re: providing Pt with counseling resources. Attempted to reach Pt at  [phone] . A woman answered and said that Pt was not there. Did not leave message. Tried Pt at [phone] and left voice message requesting return call.   Also sent MyChart message offering support.      Next Steps: SW will continue to follow for support and resource coordination.   Shanda Lecher, LCSW, ACM Social Worker [facility] [phone] [email or web address]   06/21/2022 10:30 AM
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
## 2022-11-21 NOTE — Progress Notes (Signed)
 Karen Brooks is a 32 y.o. female.  Karen Brooks reports that she has not had her menstrual cycle at all this month. She did not have symptoms indicative of pregnancy with her 67-month-old baby except for a missed period and heartburn.  She wants to get a pregnancy test.  Patient with a history of hepatitis C with very high viral count.  The patient reports she has not been treated for hepatitis yet.   Noted is also here LFTs have been elevated is possible related to her hepatitis C.  We will recheck to be certain there are stable. The patient also would like to get a physical excluding breast and genital exam.  She would like to get blood work.  To note the patient continues to smoke.    Allergies  Allergen Reactions  . Latex Rash    Prior to Admission medications   Medication Sig Taking? Last Dose  norethindrone -ethinyl estradiol (JUNEL FE 1/20) 1 mg-20 mcg (21)/75 mg (7) tablet Take 1 tablet by mouth once daily Yes Taking  ibuprofen  (MOTRIN ) 600 MG tablet Take by mouth every 6 (six) hours Patient not taking: Reported on 11/21/2022  Not Taking  norethindrone  (MICRONOR ) 0.35 mg tablet Take 1 tablet by mouth once daily Patient not taking: Reported on 06/13/2022  Not Taking  prenatal vit-iron fum-folic ac (PRENAVITE) tablet Take 1 tablet by mouth once daily Patient not taking: Reported on 06/13/2022  Not Taking  prenatal vitamin with iron-folic acid  (PRENATAL TABLETS) tablet Take 1 tablet by mouth once daily Patient not taking: Reported on 11/21/2022  Not Taking    Current medications, allergies, problem list, personally reviewed on Epic today.  Objective:   Vitals:   11/21/22 1512  BP: 135/84  Pulse: 87  SpO2: 99%  Weight: 95.7 kg (211 lb)  Height: 162.6 cm (5' 4)  PainSc:   5  PainLoc: Hip   Body mass index is 36.22 kg/m.  GENERAL: Alert and oriented, in no acute distress. SKIN: No rash, lesions, normal color, no diaphoresis. HEENT: [place]/AT, clear sclera, conjunctiva  clear, canals clear, TM normal. Other exam deferred today due to COVID. NECK: Supple, no masses; no lymphadenopathy. LUNGS: Respirations unlabored; clear to auscultation bilaterally, no wheezing, rales or rhonchi.  CARDIOVASCULAR: Regular, rate, and rhythm without murmurs, gallops or rubs. ABDOMEN: Soft; nontender; nondistended; no masses or organomegaly. EXTREMITIES: No edema or cyanosis. NEUROLOGIC: Normal mentation; moves all extremities well, sensation normal. BREAST: Exam was deferred. GENITAL: Exam was deferred.   Assessment / Plan:   Diagnoses and all orders for this visit:  Amenorrhea -     HCG Urine Qualitative, Pregnancy Test  Depression screening (Z13.31) -     Depression Screen -(PHQ- 2/9, BDI)  Elevated LFTs -     Comprehensive Metabolic Panel (CMP); Future  Hepatitis C virus infection, unspecified chronicity -     Hepatitis C Virus (HCV), PCR, Quantitative; Future  Screening for cardiovascular condition -     Lipid Panel W/Reflex Direct Low Density Lipoprotein (LDL) Cholesterol; Future  Well female exam without gynecological exam -     Complete Blood Count (CBC); Future     1. Amenorrhea The patient reports that she has not had her period this month. She is sexually active without using any contraception. She is not breast-feeding and has a 42-month old baby.  We will do a screening for pregnancy. Based on the results, we should proceed accordingly. To note her pregnancy test was negative.  2. Elevated LFTs The patient has a  history of elevated LFTs, it could be because of her hepatitis C infection.  Other etiologies also need to be considered. We will check levels today.   3. Hepatitis C virus infection The patient has a confirmed hepatitis C virus infection and has not been treated. We will check viral levels today.   4. Screening for cardiovascular condition We will check lipids today.   5. Polysubstance abuse The patient reports she continues to smoke  at home, but when she goes to a party, she will use drugs including marijuana, drug snorting, and other substances. She was advised to refrain this.   6. Well exam without gynecologic examination The exam was unremarkable. We discussed anticipatory health guidelines appropriate for age including physical activity, weight loss, the use of seat belts, not drinking and driving, sunblock, etc. We will do lab work today. Another physical in 1 year or sooner as needed.  This visit was coded based on medical decision making (MDM).   A copy of instructions have been given to the patient or responsible adult who demonstrated the ability to learn, asked appropriate questions, and verbalized understanding of the plan of care.  There were no barriers to learning identified.   Document Attestation: Karen Brooks, have reviewed and updated documentation for Karen E OLMEDO, MD, utilizing [employer].   I personally saw and evaluated the patient. I reviewed the encounter specialist portions of this document for correct information and accuracy.

## 2023-03-29 NOTE — Progress Notes (Signed)
 [facility] [employer] was not able to reach the patient by phone call.  The details of interventions are as follows:  Preventive Care-     03/29/2023   10:11 AM  Gap Closure  Age Group: Adult  Adult Yearly Physical Interventions Unable to reach - MyChart Unavailable  Comments: Phone was picked up and then hung up    DANA PAYTON  For more information on [facility] services, click here.

## 2023-04-24 NOTE — L&D Delivery Note (Signed)
 Date of delivery: 03/24/24 Estimated Date of Delivery: 04/02/24 EGA: [redacted]w[redacted]d  Hospital Course summary: Karen Brooks is a 33 y.o. H5E7897 @ [redacted]w[redacted]d who was admitted on 03/24/2024 in active labor. Patient had no prenatal care. Complete OB panel was ordered. Also PIH labs due to severe pressure and UDS. She was unable to void. Declined cervical checks after initial check. Repeatedly took blood pressure cuff off. Was reluctant to answer most questions making a complete history challenging. Refused GBS and GC/CT swabs. Urine was collected via I&O cath post delivery.   Delivery Note At 3:54 AM a viable female was delivered via Vaginal, Spontaneous (Presentation:      ).  APGAR: 8, 9; weight 6 lb 13.7 oz (3110 g).   Placenta status:  ,  .  Cord:   with the following complications: none  Anesthesia:  NA Episiotomy:  NA Lacerations:  first degree perineal lac, hemostatic, no repair Est. Blood Loss (mL):  100  Mom to postpartum.  Baby to Couplet care / Skin to Skin.  Lolita Loots 03/24/2024, 4:40 AM    Delivery Narrative  Karen Brooks was complete at [phone] and began pushing at the same time. She delivered a vigorous female infant named Rome at [facility]. Apgars 8,9. The head followed by shoulders which were delivered without difficulty, and the rest of the body. Baby was immediately placed skin to skin on mother's chest. No Nuchal cord was noted although true knot was present in cord. Delayed cord clamping with cord clamped and cut by CNM. Cord blood was obtained as patient's blood type was pending. Placenta was delivered at 0400, primarily by maternal efforts and with some slight cord traction. Placenta was intact, Shultz mechanism, 3 VC. Perineum inspected and found to have a first degree perineal laceration which was hemostatic and did not require repair. AMSTL with IV pitocin  per unit protocol. EBL 100. Mother and baby stable.

## 2023-07-26 NOTE — Progress Notes (Signed)
 [facility] [employer] was not able to reach the patient by phone call.  The details of interventions are as follows:  Preventive Care-     07/26/2023    1:48 PM  Gap Closure  Age Group: Adult  Adult Primary Care Office Visit Unable to reach - MyChart Unavailable  Comments: LVM for pt.    Karen Brooks  For more information on [facility] services, click here.

## 2023-10-11 NOTE — Progress Notes (Signed)
 Social Work Progress Note                                           OB/GYN Clinic  Referral Source: Hadassah Nap, Front Desk  Referral Reason: Transportation  Summary: SW completed an outreach call to the pt following a recent referral. SW was unable to speak with the pt but left a message with SW's name and contact number. SW also sent an introductory MyChart message to the pt with resources. SW encouraged the pt to reach out if assistance was needed.  10/11/2023 12:06 PM  Kurtis Cicero SILK Social Worker Ob/Gyn Clinics [phone] (O) [email or web address]

## 2023-10-15 NOTE — Progress Notes (Signed)
 Social Work Progress Note                                           OB/GYN Clinic  Referral Source: Hadassah Nap, Front Desk  Referral Reason: Transportation  Summary: SW spoke with the pt during her Dr. Tomasa today. Pt stated she was late to her appt due to construction being done on the road. In addition, pt shared she is in the process of moving to [place] at this time and stated transportation should not be in issue going forward. Pt also confirmed receiving my message from the previous encounter and stated she has my number saved for future needs. SW also sent the pt information regarding transportation requests and reimbursement. SW encouraged the pt to reach out if she needs assistance with resources.   10/15/2023 3:56 PM    Kurtis Cicero SILK Social Worker Ob/Gyn Clinics [phone] (O) [email or web address]

## 2023-10-17 NOTE — Telephone Encounter (Signed)
 needs to reschedule the NOB appt she missed 6/26

## 2023-12-03 NOTE — Telephone Encounter (Signed)
 Patient called per provider as this is the 3rd NOB appointment she has not showed up to. Patient states she was informed that she needs to have an ultrasound prior to being seen by a provider. Informed patient we can see her without the ultrasound as she has had one 6/24 but patient insistent that she needs US  first. Patient also states she did show up to appointment yesterday but showed up at the wrong location. Patient states she has had a hard year and was not anticipating getting pregnant but also verbalizes being excited and wanting to be seen. Patient states she is moving, had a death in her family last year, and recently got a new car as current stressors and limitations. Patient denies any need for resources at this time and states when they call her to reschedule US  she will make a NOB appointment.

## 2023-12-13 NOTE — Progress Notes (Addendum)
 [facility] [employer] was not able to reach the patient by phone call.  The details of interventions are as follows:     12/13/2023    5:00 PM  Gap Closure  Age Group: Adult  Adult Primary Care Office Visit Unable to reach - MyChart Unavailable  Comments: lvm    Karen Brooks    [address]; [place], [place] [address] l  [email or web address] l [phone]   For more information on [facility] services, click here.

## 2023-12-26 NOTE — Progress Notes (Signed)
 [facility] [employer] was not able to reach the patient by phone call.  The details of interventions are as follows:     12/26/2023    2:28 PM  Gap Closure  Age Group: Adult  Adult Primary Care Office Visit Unable to reach - MyChart Unavailable    Karen Brooks    [address]; [place], [place] [address] l  [email or web address] l [phone]   For more information on [facility] services, click here.

## 2024-02-05 NOTE — Progress Notes (Signed)
 [facility] [employer] was not able to reach the patient by phone call.  The details of interventions are as follows:     02/05/2024   12:06 PM  Gap Closure  Age Group: Adult  Adult Yearly Physical Interventions Unable to reach - MyChart Unavailable  Comments: NIS    [facility]    [address]; [place], [place] [address] l  [email or web address] l [phone]   For more information on [facility] services, click here.

## 2024-03-24 ENCOUNTER — Encounter: Payer: Self-pay | Admitting: Obstetrics & Gynecology

## 2024-03-24 ENCOUNTER — Inpatient Hospital Stay
Admission: EM | Admit: 2024-03-24 | Discharge: 2024-03-25 | DRG: 806 | Disposition: A | Attending: Obstetrics & Gynecology | Admitting: Obstetrics & Gynecology

## 2024-03-24 DIAGNOSIS — B182 Chronic viral hepatitis C: Secondary | ICD-10-CM | POA: Diagnosis not present

## 2024-03-24 DIAGNOSIS — Z3A38 38 weeks gestation of pregnancy: Secondary | ICD-10-CM

## 2024-03-24 DIAGNOSIS — O0933 Supervision of pregnancy with insufficient antenatal care, third trimester: Secondary | ICD-10-CM

## 2024-03-24 DIAGNOSIS — O479 False labor, unspecified: Principal | ICD-10-CM | POA: Diagnosis present

## 2024-03-24 DIAGNOSIS — O9842 Viral hepatitis complicating childbirth: Secondary | ICD-10-CM | POA: Diagnosis not present

## 2024-03-24 DIAGNOSIS — O134 Gestational [pregnancy-induced] hypertension without significant proteinuria, complicating childbirth: Principal | ICD-10-CM | POA: Diagnosis present

## 2024-03-24 DIAGNOSIS — O0930 Supervision of pregnancy with insufficient antenatal care, unspecified trimester: Secondary | ICD-10-CM

## 2024-03-24 DIAGNOSIS — O09293 Supervision of pregnancy with other poor reproductive or obstetric history, third trimester: Secondary | ICD-10-CM

## 2024-03-24 DIAGNOSIS — Z3A39 39 weeks gestation of pregnancy: Secondary | ICD-10-CM

## 2024-03-24 DIAGNOSIS — F1990 Other psychoactive substance use, unspecified, uncomplicated: Secondary | ICD-10-CM | POA: Insufficient documentation

## 2024-03-24 DIAGNOSIS — Z7982 Long term (current) use of aspirin: Secondary | ICD-10-CM

## 2024-03-24 LAB — PROTEIN / CREATININE RATIO, URINE
Creatinine, Urine: 116 mg/dL
Protein Creatinine Ratio: 0.23 mg/mg{creat} — ABNORMAL HIGH (ref 0.00–0.15)
Total Protein, Urine: 27 mg/dL

## 2024-03-24 LAB — RESP PANEL BY RT-PCR (RSV, FLU A&B, COVID)  RVPGX2
Influenza A by PCR: NEGATIVE
Influenza B by PCR: NEGATIVE
Resp Syncytial Virus by PCR: NEGATIVE
SARS Coronavirus 2 by RT PCR: NEGATIVE

## 2024-03-24 LAB — COMPREHENSIVE METABOLIC PANEL WITH GFR
ALT: 31 U/L (ref 0–44)
AST: 49 U/L — ABNORMAL HIGH (ref 15–41)
Albumin: 3 g/dL — ABNORMAL LOW (ref 3.5–5.0)
Alkaline Phosphatase: 188 U/L — ABNORMAL HIGH (ref 38–126)
Anion gap: 12 (ref 5–15)
BUN: 8 mg/dL (ref 6–20)
CO2: 20 mmol/L — ABNORMAL LOW (ref 22–32)
Calcium: 8.3 mg/dL — ABNORMAL LOW (ref 8.9–10.3)
Chloride: 107 mmol/L (ref 98–111)
Creatinine, Ser: 0.54 mg/dL (ref 0.44–1.00)
GFR, Estimated: >60 mL/min (ref >60)
Glucose, Bld: 99 mg/dL (ref 70–99)
Potassium: 3.7 mmol/L (ref 3.5–5.1)
Sodium: 139 mmol/L (ref 135–145)
Total Bilirubin: 0.4 mg/dL (ref 0.0–1.2)
Total Protein: 6.4 g/dL — ABNORMAL LOW (ref 6.5–8.1)

## 2024-03-24 LAB — RAPID HIV SCREEN (HIV 1/2 AB+AG)
HIV 1/2 Antibodies: NONREACTIVE
HIV-1 P24 Antigen - HIV24: NONREACTIVE

## 2024-03-24 LAB — URINALYSIS, COMPLETE (UACMP) WITH MICROSCOPIC
Bilirubin Urine: NEGATIVE
Glucose, UA: NEGATIVE mg/dL
Ketones, ur: NEGATIVE mg/dL
Leukocytes,Ua: NEGATIVE
Nitrite: NEGATIVE
Protein, ur: NEGATIVE mg/dL
RBC / HPF: >50 RBC/hpf (ref 0–5)
Specific Gravity, Urine: 1.023 (ref 1.005–1.030)
pH: 6 (ref 5.0–8.0)

## 2024-03-24 LAB — TYPE AND SCREEN
ABO/RH(D): B POS
Antibody Screen: NEGATIVE

## 2024-03-24 LAB — URINE DRUG SCREEN
Amphetamines: NEGATIVE
Barbiturates: NEGATIVE
Benzodiazepines: NEGATIVE
Cocaine: POSITIVE — AB
Fentanyl: NEGATIVE
Methadone Scn, Ur: NEGATIVE
Opiates: NEGATIVE
Tetrahydrocannabinol: NEGATIVE

## 2024-03-24 LAB — CBC
HCT: 41.7 % (ref 36.0–46.0)
Hemoglobin: 14.4 g/dL (ref 12.0–15.0)
MCH: 29.8 pg (ref 26.0–34.0)
MCHC: 34.5 g/dL (ref 30.0–36.0)
MCV: 86.3 fL (ref 80.0–100.0)
Platelets: 284 K/uL (ref 150–400)
RBC: 4.83 MIL/uL (ref 3.87–5.11)
RDW: 13.2 % (ref 11.5–15.5)
WBC: 12.7 K/uL — ABNORMAL HIGH (ref 4.0–10.5)
nRBC: 0 % (ref 0.0–0.2)

## 2024-03-24 LAB — RUPTURE OF MEMBRANE (ROM)PLUS: Rom Plus: NEGATIVE

## 2024-03-24 LAB — SYPHILIS: RPR W/REFLEX TO RPR TITER AND TREPONEMAL ANTIBODIES, TRADITIONAL SCREENING AND DIAGNOSIS ALGORITHM: RPR Ser Ql: NONREACTIVE

## 2024-03-24 LAB — WET PREP, GENITAL
Clue Cells Wet Prep HPF POC: NONE SEEN
Sperm: NONE SEEN
Trich, Wet Prep: NONE SEEN
WBC, Wet Prep HPF POC: <10 (ref <10)
Yeast Wet Prep HPF POC: NONE SEEN

## 2024-03-24 LAB — ABO/RH: ABO/RH(D): B POS

## 2024-03-24 LAB — ETHANOL: Alcohol, Ethyl (B): <15 mg/dL (ref <15)

## 2024-03-24 MED ORDER — ACETAMINOPHEN 325 MG PO TABS
650.0000 mg | ORAL_TABLET | ORAL | Status: DC | PRN
  Administered 2024-03-24 – 2024-03-25 (×2): 650 mg via ORAL
  Filled 2024-03-24 (×2): qty 2

## 2024-03-24 MED ORDER — FENTANYL CITRATE (PF) 100 MCG/2ML IJ SOLN: 50.0000 ug | INTRAMUSCULAR | Status: DC | PRN

## 2024-03-24 MED ORDER — MISOPROSTOL 200 MCG PO TABS
ORAL_TABLET | ORAL | Status: AC
  Filled 2024-03-24: qty 4

## 2024-03-24 MED ORDER — TETANUS-DIPHTH-ACELL PERTUSSIS 5-2-15.5 LF-MCG/0.5 IM SUSP
0.5000 mL | Freq: Once | INTRAMUSCULAR | Status: DC
  Filled 2024-03-24: qty 0.5

## 2024-03-24 MED ORDER — BENZOCAINE-MENTHOL 20-0.5 % EX AERO: 1.0000 | INHALATION_SPRAY | CUTANEOUS | Status: DC | PRN

## 2024-03-24 MED ORDER — ONDANSETRON HCL 4 MG PO TABS: 4.0000 mg | ORAL_TABLET | ORAL | Status: DC | PRN

## 2024-03-24 MED ORDER — ACETAMINOPHEN 325 MG PO TABS: 650.0000 mg | ORAL_TABLET | ORAL | Status: DC | PRN

## 2024-03-24 MED ORDER — LIDOCAINE 5 % EX PTCH
1.0000 | MEDICATED_PATCH | CUTANEOUS | Status: DC
  Administered 2024-03-24: 1 via TRANSDERMAL
  Filled 2024-03-24: qty 1

## 2024-03-24 MED ORDER — ONDANSETRON HCL 4 MG/2ML IJ SOLN: 4.0000 mg | INTRAMUSCULAR | Status: DC | PRN

## 2024-03-24 MED ORDER — DIBUCAINE (PERIANAL) 1 % EX OINT: 1.0000 | TOPICAL_OINTMENT | CUTANEOUS | Status: DC | PRN

## 2024-03-24 MED ORDER — LIDOCAINE HCL (PF) 1 % IJ SOLN
30.0000 mL | INTRAMUSCULAR | Status: DC | PRN
  Filled 2024-03-24: qty 30

## 2024-03-24 MED ORDER — SOD CITRATE-CITRIC ACID 500-334 MG/5ML PO SOLN: 30.0000 mL | ORAL | Status: DC | PRN

## 2024-03-24 MED ORDER — PRENATAL MULTIVITAMIN CH
1.0000 | ORAL_TABLET | Freq: Every day | ORAL | Status: DC
  Administered 2024-03-24 – 2024-03-25 (×2): 1 via ORAL
  Filled 2024-03-24 (×3): qty 1

## 2024-03-24 MED ORDER — HYDROXYZINE HCL 25 MG PO TABS: 50.0000 mg | ORAL_TABLET | Freq: Four times a day (QID) | ORAL | Status: DC | PRN

## 2024-03-24 MED ORDER — ONDANSETRON HCL 4 MG/2ML IJ SOLN: 4.0000 mg | Freq: Four times a day (QID) | INTRAMUSCULAR | Status: DC | PRN

## 2024-03-24 MED ORDER — DIPHENHYDRAMINE HCL 25 MG PO CAPS: 25.0000 mg | ORAL_CAPSULE | Freq: Four times a day (QID) | ORAL | Status: DC | PRN

## 2024-03-24 MED ORDER — LACTATED RINGERS IV SOLN
500.0000 mL | INTRAVENOUS | Status: DC | PRN
  Administered 2024-03-24: 1000 mL via INTRAVENOUS

## 2024-03-24 MED ORDER — LABETALOL HCL 5 MG/ML IV SOLN
20.0000 mg | INTRAVENOUS | Status: DC | PRN
  Administered 2024-03-24: 20 mg via INTRAVENOUS
  Filled 2024-03-24: qty 4

## 2024-03-24 MED ORDER — NIFEDIPINE ER OSMOTIC RELEASE 30 MG PO TB24
30.0000 mg | ORAL_TABLET | Freq: Every day | ORAL | Status: DC
  Administered 2024-03-24 – 2024-03-25 (×2): 30 mg via ORAL
  Filled 2024-03-24 (×2): qty 1

## 2024-03-24 MED ORDER — COCONUT OIL OIL: 1.0000 | TOPICAL_OIL | Status: DC | PRN

## 2024-03-24 MED ORDER — LABETALOL HCL 5 MG/ML IV SOLN: 80.0000 mg | INTRAVENOUS | Status: DC | PRN

## 2024-03-24 MED ORDER — WITCH HAZEL-GLYCERIN EX PADS: 1.0000 | MEDICATED_PAD | CUTANEOUS | Status: DC | PRN

## 2024-03-24 MED ORDER — HYDRALAZINE HCL 20 MG/ML IJ SOLN: 10.0000 mg | INTRAMUSCULAR | Status: DC | PRN

## 2024-03-24 MED ORDER — AMMONIA AROMATIC IN INHA
RESPIRATORY_TRACT | Status: AC
  Filled 2024-03-24: qty 10

## 2024-03-24 MED ORDER — OXYTOCIN-SODIUM CHLORIDE 30-0.9 UT/500ML-% IV SOLN
2.5000 [IU]/h | INTRAVENOUS | Status: DC
  Filled 2024-03-24: qty 500

## 2024-03-24 MED ORDER — OXYTOCIN BOLUS FROM INFUSION
333.0000 mL | Freq: Once | INTRAVENOUS | Status: AC
  Administered 2024-03-24: 333 mL via INTRAVENOUS

## 2024-03-24 MED ORDER — SIMETHICONE 80 MG PO CHEW: 80.0000 mg | CHEWABLE_TABLET | ORAL | Status: DC | PRN

## 2024-03-24 MED ORDER — LABETALOL HCL 5 MG/ML IV SOLN
40.0000 mg | INTRAVENOUS | Status: DC | PRN
  Administered 2024-03-24: 40 mg via INTRAVENOUS
  Filled 2024-03-24: qty 8

## 2024-03-24 MED ORDER — IBUPROFEN 600 MG PO TABS
600.0000 mg | ORAL_TABLET | Freq: Four times a day (QID) | ORAL | Status: DC
  Administered 2024-03-24 – 2024-03-25 (×4): 600 mg via ORAL
  Filled 2024-03-24 (×5): qty 1

## 2024-03-24 MED ORDER — OXYTOCIN 10 UNIT/ML IJ SOLN
INTRAMUSCULAR | Status: AC
  Filled 2024-03-24: qty 2

## 2024-03-24 MED ORDER — SENNOSIDES-DOCUSATE SODIUM 8.6-50 MG PO TABS
2.0000 | ORAL_TABLET | Freq: Every day | ORAL | Status: DC
  Filled 2024-03-24: qty 2

## 2024-03-24 NOTE — Progress Notes (Signed)
 Pt is in bed sleeping. When roused only responds with movement, will not verbally respond or open eyes even when given specific instructions to do so. VS WNL. Concerned that she took personal medication or substance while alone in room. Security called to search belongings for paraphernalia. Noting of concern found. Provider notified.
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
## 2024-03-24 NOTE — Progress Notes (Signed)
 Patient is refusing fundal exams, CNM M. Cowen aware.

## 2024-03-24 NOTE — Discharge Summary (Signed)
     OB Discharge Summary     Patient Name: Karen Brooks DOB: Jul 20, 1990 MRN: [id number]  Date of admission: 03/24/2024 Delivering MD: Lolita Loots, CNM  Date of Delivery: 03/24/2024  Date of discharge: 03/24/2024  Admitting diagnosis: Uterine contractions [O47.9] Intrauterine pregnancy: [redacted]w[redacted]d     Secondary diagnosis: {OBGYN secondary diagnosis:21111118}     Discharge diagnosis: {DX.:23714}                                                                                                Post partum procedures:{Postpartum procedures:23558}  Augmentation: {Augmentation:20782::N/A}  Complications: None  Hospital course:  Onset of Labor With Vaginal Delivery      33 y.o. yo H5E7897 at [redacted]w[redacted]d was admitted in Active Labor on 03/24/2024. Labor course was complicated bynone  Membrane Rupture Time/Date: 3:05 AM,03/24/2024  Delivery Method:Vaginal, Spontaneous Operative Delivery:N/A Episiotomy: None Lacerations:  1st degree  Patient had a postpartum course complicated by ***.  She is ambulating, tolerating a regular diet, passing flatus, and urinating well. Patient is discharged home in stable condition on 03/24/24.  Newborn Data: Birth date:03/24/2024 Birth time:3:54 AM Gender:Female Living status:Living Apgars:8 ,9  Weight:3110 g  Subjective:   Physical exam  Vitals:   03/24/24 0345 03/24/24 0410 03/24/24 0420 03/24/24 0430  BP: (!) 153/81 (!) 162/101 (!) 141/118 131/63  Pulse: 77 85 64 66   General: {Exam; general:21111117} Breast: soft, non-tender, nipples without breakdown Lochia: {Desc; appropriate/inappropriate:30686::appropriate} Uterine Fundus: {Desc; firm/soft:30687} Perineum: no erythema or foul odor discharge, minimal edema DVT Evaluation: {Exam; dvt:2111122}  Labs: Lab Results  Component Value Date   WBC 11.2 (H) 09/16/2017   HGB 14.0 09/26/2018   HCT 45.9 09/16/2017   MCV 91.2 09/16/2017   PLT 191 09/26/2018    Discharge instruction: in After  Visit Summary.  Medications:  Allergies as of 03/24/2024       Reactions   Latex Rash     Med Rec must be completed prior to using this SMARTLINK***        Activity: Advance as tolerated. Pelvic rest for 6 weeks.   Outpatient follow up:  Follow-up Information     [facility] Follow up.   Specialty: Obstetrics and Gynecology Why: call to schedule a two week telehealth visit and six week in person postpartum visit Contact information: [address] [place]  [phone]                  Postpartum contraception: {Contraceptives:21111124} Rhogam Given postpartum: {YES NO:22349} Rubella vaccine given postpartum: {YES NO:22349} Varicella vaccine given postpartum: {YES NO:22349} TDaP given antepartum or postpartum: ***  Newborn Data: Live born female  Birth Weight: 6 lb 13.7 oz (3110 g) APGAR: 8, 9  Newborn Delivery   Birth date/time: 03/24/2024 03:54:00 Delivery type: Vaginal, Spontaneous      Baby Feeding: {Baby feeding:23562}  Disposition:{CHL IP OB HOME WITH FNUYZM:76418}   Lolita Loots, CNM, FNP 03/24/2024 4:48 AM

## 2024-03-24 NOTE — TOC Progression Note (Signed)
 Transition of Care [facility]) - Progression Note    Patient Details  Name: Karen Brooks MRN: [id number] Date of Birth: [birth year]  Transition of Care [facility]) CM/SW Contact  [name], LCSW Phone Number: 03/24/2024, 12:02 PM  Clinical Narrative:    Chart reviewed. The patient is not coherent to complete consult. ICM will follow up with patient and make CPS reports.                      Expected Discharge Plan and Services                                               Social Drivers of Health (SDOH) Interventions SDOH Screenings   Food Insecurity: Patient Unable To Answer (03/24/2024)  Housing: Patient Unable To Answer (03/24/2024)  Transportation Needs: Unmet Transportation Needs (03/24/2024)  Utilities: Patient Declined (03/24/2024)  Depression (PHQ2-9): Low Risk  (11/10/2020)  Financial Resource Strain: Low Risk  (11/21/2022)   Received from [facility]  Tobacco Use: High Risk (03/24/2024)    Readmission Risk Interventions     No data to display

## 2024-03-25 ENCOUNTER — Encounter: Payer: Self-pay | Admitting: Family

## 2024-03-25 DIAGNOSIS — F1990 Other psychoactive substance use, unspecified, uncomplicated: Secondary | ICD-10-CM | POA: Insufficient documentation

## 2024-03-25 DIAGNOSIS — O0930 Supervision of pregnancy with insufficient antenatal care, unspecified trimester: Secondary | ICD-10-CM

## 2024-03-25 DIAGNOSIS — B182 Chronic viral hepatitis C: Secondary | ICD-10-CM | POA: Diagnosis not present

## 2024-03-25 DIAGNOSIS — O9842 Viral hepatitis complicating childbirth: Secondary | ICD-10-CM | POA: Diagnosis not present

## 2024-03-25 DIAGNOSIS — O0933 Supervision of pregnancy with insufficient antenatal care, third trimester: Secondary | ICD-10-CM | POA: Diagnosis not present

## 2024-03-25 LAB — CBC
HCT: 43.2 % (ref 36.0–46.0)
Hemoglobin: 14.2 g/dL (ref 12.0–15.0)
MCH: 29.9 pg (ref 26.0–34.0)
MCHC: 32.9 g/dL (ref 30.0–36.0)
MCV: 90.9 fL (ref 80.0–100.0)
Platelets: 291 K/uL (ref 150–400)
RBC: 4.75 MIL/uL (ref 3.87–5.11)
RDW: 13.7 % (ref 11.5–15.5)
WBC: 14.4 K/uL — ABNORMAL HIGH (ref 4.0–10.5)
nRBC: 0 % (ref 0.0–0.2)

## 2024-03-25 LAB — VARICELLA ZOSTER ANTIBODY, IGG: Varicella IgG: REACTIVE

## 2024-03-25 LAB — RUBELLA SCREEN: Rubella: 5.35 {index} (ref >0.99)

## 2024-03-25 MED ORDER — ACETAMINOPHEN 325 MG PO TABS: 650.0000 mg | ORAL_TABLET | ORAL | Status: AC | PRN

## 2024-03-25 MED ORDER — WITCH HAZEL-GLYCERIN EX PADS: 1.0000 | MEDICATED_PAD | CUTANEOUS | Status: AC | PRN

## 2024-03-25 MED ORDER — LIDOCAINE 5 % EX PTCH: 1.0000 | MEDICATED_PATCH | CUTANEOUS | 0 refills | Status: AC

## 2024-03-25 MED ORDER — NIFEDIPINE ER OSMOTIC RELEASE 30 MG PO TB24: 30.0000 mg | ORAL_TABLET | Freq: Every day | ORAL | 11 refills | Status: AC

## 2024-03-25 MED ORDER — IBUPROFEN 600 MG PO TABS: 600.0000 mg | ORAL_TABLET | Freq: Four times a day (QID) | ORAL | 0 refills | Status: AC

## 2024-03-25 MED ORDER — DIBUCAINE (PERIANAL) 1 % EX OINT: 1.0000 | TOPICAL_OINTMENT | CUTANEOUS | 0 refills | Status: AC | PRN

## 2024-03-25 MED ORDER — BENZOCAINE-MENTHOL 20-0.5 % EX AERO: 1.0000 | INHALATION_SPRAY | CUTANEOUS | Status: AC | PRN

## 2024-03-25 NOTE — Progress Notes (Signed)
 Pt went downstairs to smoke a cigarette around 1115. RN told pt to come right back upstairs after she is finished smoking. RN checked pts room and called SCN around 1155. Pt not in room. RN called security. Pt returned to room at 1230. Pt told not to leave floor again. Pt verbalized understanding. Harlene Cisco CNM notified.

## 2024-03-25 NOTE — Clinical Social Work Maternal (Addendum)
 CLINICAL SOCIAL WORK MATERNAL/CHILD NOTE  Patient Details  Name: Karen Brooks MRN: [id number] Date of Birth: 1990/05/18  Date:  03/25/2024  Clinical Social Worker Initiating Note:  Sedrick Brooks Date/Time: Initiated:  03/25/24/0100     Child's Name:  Karen Brooks   Biological Parents:  Mother   Need for Interpreter:  None   Reason for Referral:  Current Substance Use/Substance Use During Pregnancy  , Homelessness   Address:  [address] [place] [address]    Phone number:  [phone] (home)     Additional phone number:   Household Members/Support Persons (HM/SP):   Household Member/Support Person 1, Household Member/Support Person 2   HM/SP Name Relationship DOB or Age  HM/SP -1 Karen Brooks Daughter of Patient 5  HM/SP -2 Karen Brooks Daughter Brooks 1  HM/SP -3        HM/SP -4        HM/SP -5        HM/SP -6        HM/SP -7        HM/SP -8          Natural Supports (not living in the home):  Friends   Professional Supports:     Employment: Unemployed   Type of Work:     Education:  Engineer, agricultural   Homebound arranged:    Surveyor, Quantity Resources:  Medicaid   Other Resources:  Sales Executive     Cultural/Religious Considerations Which May Impact Care:    Strengths:  Compliance with medical plan  , Home prepared for child  , Pediatrician chosen   Psychotropic Medications:         Pediatrician:     (Patient reports that she want the baby to use [facility] for medical needs.)  Pediatrician List:   [facility] [place]    [place]    [facility]      Pediatrician Fax Number:    Risk Factors/Current Problems:  Substance Use  , Basic Needs     Cognitive State:  Alert     Mood/Affect:  Calm     CSW Assessment:  Chart reviewed. I received a consult positive toxicology screen for mother and baby. Mother and baby tested positive for cocaine and THC. I was able to speak with the  patient at bedside today. The patient was a little guarded at first. The patient reports being sad about being with her children. The patient reports that she was sore after delivery. The patient reports that she is moving. The patient reports that her new address is [address] [place] and telephone number is [phone].   The patient reports that the FOB is Karen Brooks and his is currently incarcerated. The patient reports that she has supportive friends. The patient reports that she lives in the home with her two daughters. The patient reports that her highest level of education is GED. The patient reports that she is currently unemployed. The patient reports that she receives Sales [employer]. The patient reports that she has no mental health history. The patient reports not being active in the therapy or counseling. The patient denies any past or current SI/HI/DV. The patient reports that the baby will be going to [facility] primary care for medical appointments. The patient reports that she has a crib, diapers, clothes, and car seat for the baby.   The patient reports that her  friends will assist her during D/C.  I reviewed information on post partum depression, sudden infant death syndrome, car seat safety, mental health resources, safe sleep environments, and community post partum resources. The patient verbalized understanding.   I informed the patient of the law and hospital policy on drug exposure with children. I informed the patient of CPS report. The patient verbalized understanding. The patient was interested in treatment facilities for women and children.   I have made the CPS report with [facility] DSS with freda drewry. SW also gave patient a list of SU treatment facilities for women and children.   CSW Plan/Description:  Child Protective Service Report  , Other Information/Referral to [employer], Hospital Drug Screen Policy Information, Perinatal Mood and Anxiety Disorder  (PMADs) Education, Sudden Infant Death Syndrome (SIDS) Education    Karen JINNY Ruts, LCSW 03/25/2024, 1:31 PM

## 2024-03-27 ENCOUNTER — Telehealth: Payer: Self-pay | Admitting: Certified Nurse Midwife

## 2024-03-27 LAB — HEPATITIS B SURFACE ANTIGEN: Hepatitis B Surface Ag: NONREACTIVE

## 2024-03-27 LAB — HEPATITIS C ANTIBODY: HCV Ab: REACTIVE — AB

## 2024-03-27 NOTE — Telephone Encounter (Signed)
 LVMTRC to schedule 1 wk BP check and 6 wk PPV.
